# Patient Record
Sex: Male | Born: 1960 | Race: Black or African American | Hispanic: No | Marital: Married | Smoking: Never smoker
Health system: Southern US, Community
[De-identification: ages and names within clinical notes are randomized; demographics above are authoritative.]

## PROBLEM LIST (undated history)

## (undated) DIAGNOSIS — G20A1 Parkinson's disease without dyskinesia, without mention of fluctuations: Secondary | ICD-10-CM

## (undated) DIAGNOSIS — I10 Essential (primary) hypertension: Secondary | ICD-10-CM

## (undated) DIAGNOSIS — R011 Cardiac murmur, unspecified: Secondary | ICD-10-CM

## (undated) DIAGNOSIS — G20 Parkinson's disease: Secondary | ICD-10-CM

## (undated) DIAGNOSIS — J45909 Unspecified asthma, uncomplicated: Secondary | ICD-10-CM

## (undated) DIAGNOSIS — E785 Hyperlipidemia, unspecified: Secondary | ICD-10-CM

## (undated) HISTORY — DX: Cardiac murmur, unspecified: R01.1

## (undated) HISTORY — DX: Parkinson's disease: G20

## (undated) HISTORY — DX: Unspecified asthma, uncomplicated: J45.909

## (undated) HISTORY — PX: CARDIAC ELECTROPHYSIOLOGY STUDY AND ABLATION: SHX1294

## (undated) HISTORY — PX: KNEE ARTHROPLASTY: SHX992

## (undated) HISTORY — DX: Essential (primary) hypertension: I10

## (undated) HISTORY — DX: Parkinson's disease without dyskinesia, without mention of fluctuations: G20.A1

## (undated) HISTORY — DX: Hyperlipidemia, unspecified: E78.5

## (undated) HISTORY — PX: EYE SURGERY: SHX253

## (undated) HISTORY — PX: OTHER SURGICAL HISTORY: SHX169

---

## 2011-01-30 ENCOUNTER — Ambulatory Visit: Payer: Self-pay | Admitting: Unknown Physician Specialty

## 2015-10-16 ENCOUNTER — Encounter: Payer: Self-pay | Admitting: Nurse Practitioner

## 2015-10-25 ENCOUNTER — Ambulatory Visit: Payer: Self-pay

## 2015-10-31 ENCOUNTER — Encounter: Payer: Self-pay | Admitting: Urology

## 2015-10-31 ENCOUNTER — Ambulatory Visit (INDEPENDENT_AMBULATORY_CARE_PROVIDER_SITE_OTHER): Payer: Managed Care, Other (non HMO) | Admitting: Urology

## 2015-10-31 VITALS — BP 141/97 | HR 80 | Ht 70.0 in | Wt 222.7 lb

## 2015-10-31 DIAGNOSIS — R972 Elevated prostate specific antigen [PSA]: Secondary | ICD-10-CM | POA: Diagnosis not present

## 2015-10-31 DIAGNOSIS — N400 Enlarged prostate without lower urinary tract symptoms: Secondary | ICD-10-CM

## 2015-10-31 NOTE — Progress Notes (Signed)
10/31/2015 3:26 PM   James Bush 1960-09-13 [id number]  Referring provider: Ronnell Freshwater, NP [place], [place] [address]  Chief Complaint  Patient presents with  . New Patient (Initial Visit)    elevated PSA    HPI: The patient is a 55 year old gentleman presents for evaluation of a PSA elevated to 6.25. Resolves, the patient has no previous history of an elevated PSA. He did have an episode of hematuria in his 60s and underwent cystoscopy for that with the urologist. He has not had hematuria since that time. He reports a normal voiding at this time. He has no history of nephrolithiasis the urologist he saw his 85s presume this was the cause of his hematuria.   PMH: Past Medical History  Diagnosis Date  . Asthma   . Heart murmur   . Hypertension   . Hyperlipidemia     Surgical History: Past Surgical History  Procedure Laterality Date  . Ablasion      Home Medications:    Medication List       This list is accurate as of: 10/31/15  3:26 PM.  Always use your most recent med list.               hydrochlorothiazide 12.5 MG tablet  Commonly known as:  HYDRODIURIL  TAKE 1 TABLET EVERY DAY FOR BLOOD PRESSURE     metoprolol tartrate 25 MG tablet  Commonly known as:  LOPRESSOR  TAKE 1 TABLET(S) BY MOUTH TWICE A DAY FOR BLOOD PRESSURE     simvastatin 40 MG tablet  Commonly known as:  ZOCOR  Take 40 mg by mouth every evening.        Allergies: No Known Allergies  Family History: Family History  Problem Relation Age of Onset  . Hematuria Neg Hx   . Kidney cancer Neg Hx   . Prostate cancer Neg Hx     Social History:  reports that he has never smoked. He does not have any smokeless tobacco history on file. He reports that he does not drink alcohol or use illicit drugs.  ROS: UROLOGY Frequent Urination?: No Hard to postpone urination?: No Burning/pain with urination?: No Get up at night to urinate?: No Leakage of urine?: No Urine stream  starts and stops?: No Trouble starting stream?: No Do you have to strain to urinate?: No Blood in urine?: No Urinary tract infection?: No Sexually transmitted disease?: No Injury to kidneys or bladder?: No Painful intercourse?: No Weak stream?: No Erection problems?: No Penile pain?: No  Gastrointestinal Nausea?: No Vomiting?: No Indigestion/heartburn?: No Diarrhea?: No Constipation?: No  Constitutional Fever: No Night sweats?: No Weight loss?: No Fatigue?: No  Skin Skin rash/lesions?: No Itching?: No  Eyes Blurred vision?: No Double vision?: No  Ears/Nose/Throat Sore throat?: No Sinus problems?: No  Hematologic/Lymphatic Swollen glands?: No Easy bruising?: No  Cardiovascular Leg swelling?: No Chest pain?: No  Respiratory Cough?: Yes Shortness of breath?: No  Endocrine Excessive thirst?: No  Musculoskeletal Back pain?: No Joint pain?: Yes  Neurological Headaches?: No Dizziness?: No  Psychologic Depression?: No Anxiety?: No  Physical Exam: BP 141/97 mmHg  Pulse 80  Ht 5\' 10"  (1.778 m)  Wt 222 lb 11.2 oz (101.016 kg)  BMI 31.95 kg/m2  Constitutional:  Alert and oriented, No acute distress. HEENT: [place] AT, moist mucus membranes.  Trachea midline, no masses. Cardiovascular: No clubbing, cyanosis, or edema. Respiratory: Normal respiratory effort, no increased work of breathing. GI: Abdomen is soft, nontender, nondistended,  no abdominal masses GU: No CVA tenderness. Normal phallus. Testicles descended equally bilaterally. Nontender palpation. No masses. DRE: 2+, smooth no nodules. Skin: No rashes, bruises or suspicious lesions. Lymph: No cervical or inguinal adenopathy. Neurologic: Grossly intact, no focal deficits, moving all 4 extremities. Psychiatric: Normal mood and affect.  Laboratory Data: No results found for: WBC, HGB, HCT, MCV, PLT  No results found for: CREATININE  No results found for: PSA  No results found for:  TESTOSTERONE  No results found for: HGBA1C  Urinalysis No results found for: COLORURINE, APPEARANCEUR, LABSPEC, PHURINE, GLUCOSEU, HGBUR, BILIRUBINUR, KETONESUR, PROTEINUR, UROBILINOGEN, NITRITE, LEUKOCYTESUR   Assessment & Plan:    I had a long discussion with the patient discussing the role of PSA screening for prostate cancer. He understands it is somewhat controversial tests, however he knows that his elevated place approximately 25% chance of having prostate cancer. We discussed the next step for elevated PSAs to confirm sure elevation by prostate biopsy. We discussed prostate biopsy in great detail. He understands risks, benefits, indications. He understands the risks include but are not limited to bleeding and infection. He understands her of the blood in his stool, seen, and urine after the procedure. He also understands is less than 1% chance that he will need IV antibiotics in the hospital following the procedure if he develops sepsis. All questions were answered and the patient chooses to proceed.  1. Elevated PSA -Confirm elevation with repeat PSA -Tentatively schedule prostate biopsy in 1-2 weeks pending results of above  2. BPH Asymptomatic. No intervention indicated.   Return in about 1 week (around 11/07/2015) for prostate biopsy.  [name], MD  [facility] [name], [place], [place] [address] [phone]

## 2015-11-01 LAB — PSA: PROSTATE SPECIFIC AG, SERUM: 4.5 ng/mL — AB (ref 0.0–4.0)

## 2015-11-04 ENCOUNTER — Telehealth: Payer: Self-pay

## 2015-11-04 NOTE — Telephone Encounter (Signed)
No answer

## 2015-11-04 NOTE — Telephone Encounter (Signed)
-----   Message from [name], MD sent at 11/01/2015 12:58 PM EST ----- Please let patient know this his repeat PSA is still elevated, so we will proceed with prostate biopsy as we discussed yesterday.

## 2015-11-05 NOTE — Telephone Encounter (Signed)
Spoke with pt wife in reference to PSA and prostate bx. Wife voiced understanding.

## 2015-11-14 ENCOUNTER — Encounter: Payer: Self-pay | Admitting: Urology

## 2015-11-14 ENCOUNTER — Other Ambulatory Visit: Payer: Self-pay | Admitting: Urology

## 2015-11-14 ENCOUNTER — Ambulatory Visit (INDEPENDENT_AMBULATORY_CARE_PROVIDER_SITE_OTHER): Payer: Managed Care, Other (non HMO) | Admitting: Urology

## 2015-11-14 VITALS — BP 151/101 | HR 76 | Ht 70.0 in | Wt 223.3 lb

## 2015-11-14 DIAGNOSIS — R972 Elevated prostate specific antigen [PSA]: Secondary | ICD-10-CM

## 2015-11-14 NOTE — Progress Notes (Signed)
Prostate Biopsy Procedure   Informed consent was obtained after discussing risks/benefits of the procedure.  A time out was performed to ensure correct patient identity.  Pre-Procedure: - Last PSA Level: 6.25, 4.5  - Gentamicin 80 mg IM given prophylactically - Levaquin 500 mg administered PO -Transrectal Ultrasound performed revealing a 55 gm prostate -No significant hypoechoic or median lobe noted  Procedure: - Prostate block performed using 10 cc 1% lidocaine and biopsies taken from sextant areas, a total of 12 under ultrasound guidance.  Post-Procedure: - Patient tolerated the procedure well - He was counseled to seek immediate medical attention if experiences any severe pain, significant bleeding, or fevers - Return in one week to discuss biopsy results

## 2015-11-22 LAB — PATHOLOGY REPORT

## 2015-11-26 ENCOUNTER — Other Ambulatory Visit: Payer: Self-pay | Admitting: Urology

## 2015-11-28 ENCOUNTER — Ambulatory Visit (INDEPENDENT_AMBULATORY_CARE_PROVIDER_SITE_OTHER): Payer: Managed Care, Other (non HMO) | Admitting: Urology

## 2015-11-28 VITALS — BP 153/107 | HR 70 | Ht 70.0 in | Wt 224.0 lb

## 2015-11-28 DIAGNOSIS — C61 Malignant neoplasm of prostate: Secondary | ICD-10-CM

## 2015-11-28 NOTE — Progress Notes (Signed)
11/28/2015 12:04 PM   [name] May 05, 1961 [id number]  Referring provider: Lavera Guise, MD [address] [place], [place] [address]  Chief Complaint  Patient presents with  . Prostate Cancer    Biopsy results  . Results    HPI: The patient is a 55 year old gentleman presents for evaluation of a PSA elevated to 6.25. Resolves, the patient has no previous history of an elevated PSA. He did have an episode of hematuria in his 34s and underwent cystoscopy for that with the urologist. He has not had hematuria since that time. He reports a normal voiding at this time. He has no history of nephrolithiasis the urologist he saw his 71s presume this was the cause of his hematuria.   Repeat PSA was 4.5 the patient underwent a prostate biopsy. The biopsy showed one core that was 4% 3+3 = 6 prostate cancer in right medial apex. This was 3 cores of high-grade pin  PMH: Past Medical History  Diagnosis Date  . Asthma   . Heart murmur   . Hypertension   . Hyperlipidemia     Surgical History: Past Surgical History  Procedure Laterality Date  . Ablasion    . Knee arthroplasty Right     Home Medications:    Medication List       This list is accurate as of: 11/28/15 12:04 PM.  Always use your most recent med list.               aspirin 81 MG tablet  Take 81 mg by mouth daily.     hydrochlorothiazide 12.5 MG tablet  Commonly known as:  HYDRODIURIL  TAKE 1 TABLET EVERY DAY FOR BLOOD PRESSURE     metoprolol tartrate 25 MG tablet  Commonly known as:  LOPRESSOR  TAKE 1 TABLET(S) BY MOUTH TWICE A DAY FOR BLOOD PRESSURE     simvastatin 40 MG tablet  Commonly known as:  ZOCOR  Take 40 mg by mouth every evening.        Allergies: No Known Allergies  Family History: Family History  Problem Relation Age of Onset  . Hematuria Neg Hx   . Kidney cancer Neg Hx   . Prostate cancer Neg Hx     Social History:  reports that he has never smoked. He does not have any smokeless  tobacco history on file. He reports that he does not drink alcohol or use illicit drugs.  ROS: UROLOGY Frequent Urination?: No Hard to postpone urination?: No Burning/pain with urination?: No Get up at night to urinate?: No Leakage of urine?: No Urine stream starts and stops?: No Trouble starting stream?: No Do you have to strain to urinate?: No Blood in urine?: Yes Urinary tract infection?: No Sexually transmitted disease?: No Injury to kidneys or bladder?: No Painful intercourse?: No Weak stream?: No Erection problems?: No Penile pain?: No  Gastrointestinal Nausea?: No Vomiting?: No Indigestion/heartburn?: No Diarrhea?: No Constipation?: No  Constitutional Fever: No Night sweats?: No Weight loss?: No Fatigue?: No  Skin Skin rash/lesions?: No Itching?: No  Eyes Blurred vision?: No Double vision?: No  Ears/Nose/Throat Sore throat?: No Sinus problems?: No  Hematologic/Lymphatic Swollen glands?: No Easy bruising?: No  Cardiovascular Leg swelling?: No Chest pain?: No  Respiratory Cough?: No Shortness of breath?: No  Endocrine Excessive thirst?: No  Musculoskeletal Back pain?: No Joint pain?: Yes  Neurological Headaches?: No Dizziness?: No  Psychologic Depression?: No Anxiety?: No  Physical Exam: BP 153/107 mmHg  Pulse 70  Ht 5'  10" (1.778 m)  Wt 224 lb (101.606 kg)  BMI 32.14 kg/m2  Constitutional:  Alert and oriented, No acute distress. HEENT: [place] AT, moist mucus membranes.  Trachea midline, no masses. Cardiovascular: No clubbing, cyanosis, or edema. Respiratory: Normal respiratory effort, no increased work of breathing. GI: Abdomen is soft, nontender, nondistended, no abdominal masses GU: No CVA tenderness.  Skin: No rashes, bruises or suspicious lesions. Lymph: No cervical or inguinal adenopathy. Neurologic: Grossly intact, no focal deficits, moving all 4 extremities. Psychiatric: Normal mood and affect.  Laboratory Data: No  results found for: WBC, HGB, HCT, MCV, PLT  No results found for: CREATININE  No results found for: PSA  No results found for: TESTOSTERONE  No results found for: HGBA1C  Urinalysis No results found for: COLORURINE, APPEARANCEUR, LABSPEC, PHURINE, GLUCOSEU, HGBUR, BILIRUBINUR, KETONESUR, PROTEINUR, UROBILINOGEN, NITRITE, LEUKOCYTESUR  Assessment & Plan:    1. 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 We had an extended conversation regarding the natural history of prostate cancer and the various treatment and monitoring approaches. We discussed in great detail watchful waiting, active surveillance, prostatectomy, radiation therapy. He understands that he is very low risk prostate cancer, and that the trend today is active surveillance. He understands the goal of active surveillance is not denied treatment for prostate cancer but to delay until if and when necessary for more advanced disease. He understands he will need close monitoring if he chooses this pathway. He will need confirmatory prostate biopsy in 3 months to begin with. He will also need frequent DRE/PSA checks. He will also need a repeat prostate biopsy in 3 years if the above are stable. He understands the risk of this bifid includes sampling air that Mrs. more aggressive disease, possible missing of the window of cure, possible inability to perform a nerve sparing surgery, and frequent doctor's visits. The patient was given 100 questions of prostate cancer book. He will follow-up in 3 months for repeat prostate biopsy.  Return in about 3 months (around 02/28/2016) for prostate biopsy.  [name], MD  [facility] [address], [place], [place] [address] [phone]

## 2016-02-27 ENCOUNTER — Other Ambulatory Visit: Payer: Managed Care, Other (non HMO)

## 2016-03-05 ENCOUNTER — Ambulatory Visit: Payer: Managed Care, Other (non HMO)

## 2016-03-12 ENCOUNTER — Other Ambulatory Visit: Payer: Managed Care, Other (non HMO)

## 2016-03-19 ENCOUNTER — Ambulatory Visit: Payer: Managed Care, Other (non HMO)

## 2016-06-17 ENCOUNTER — Encounter: Payer: Self-pay | Admitting: Urology

## 2016-06-17 ENCOUNTER — Other Ambulatory Visit: Payer: Self-pay | Admitting: Urology

## 2016-06-17 ENCOUNTER — Ambulatory Visit (INDEPENDENT_AMBULATORY_CARE_PROVIDER_SITE_OTHER): Payer: Managed Care, Other (non HMO) | Admitting: Urology

## 2016-06-17 VITALS — BP 132/90 | HR 86 | Ht 70.0 in | Wt 218.8 lb

## 2016-06-17 DIAGNOSIS — R972 Elevated prostate specific antigen [PSA]: Secondary | ICD-10-CM

## 2016-06-17 MED ORDER — GENTAMICIN SULFATE 40 MG/ML IJ SOLN
80.0000 mg | Freq: Once | INTRAMUSCULAR | Status: AC
  Administered 2016-06-17: 80 mg via INTRAMUSCULAR

## 2016-06-17 MED ORDER — LEVOFLOXACIN 500 MG PO TABS
500.0000 mg | ORAL_TABLET | Freq: Once | ORAL | Status: AC
  Administered 2016-06-17: 500 mg via ORAL

## 2016-06-17 NOTE — Progress Notes (Signed)
Prostate Biopsy Procedure   HPI: History of elevated PSA (6.25 followed by 4.5) with previous biopsy results below presenting for active surveillance repeat prostate biopsy.   Previous biopsy: One core that was 4% 3+3 = 6 prostate cancer in right medial apex. Three cores of high-grade pin    Informed consent was obtained after discussing risks/benefits of the procedure.  A time out was performed to ensure correct patient identity.  Pre-Procedure: - Last PSA Level: 4.5 - Gentamicin given prophylactically - Levaquin 500 mg administered PO -Transrectal Ultrasound performed revealing a 52.82 gm prostate -No significant hypoechoic or median lobe noted  Procedure: - Prostate block performed using 10 cc 1% lidocaine and biopsies taken from sextant areas, a total of 12 under ultrasound guidance.  Post-Procedure: - Patient tolerated the procedure well - He was counseled to seek immediate medical attention if experiences any severe pain, significant bleeding, or fevers - Return in one week to discuss biopsy results - PSA drawn today pre-procedure.

## 2016-06-18 ENCOUNTER — Other Ambulatory Visit: Payer: Managed Care, Other (non HMO)

## 2016-06-18 LAB — PSA: PROSTATE SPECIFIC AG, SERUM: 8.5 ng/mL — AB (ref 0.0–4.0)

## 2016-06-23 LAB — PATHOLOGY REPORT

## 2016-06-24 ENCOUNTER — Other Ambulatory Visit: Payer: Self-pay | Admitting: Urology

## 2016-06-25 ENCOUNTER — Ambulatory Visit: Payer: Managed Care, Other (non HMO)

## 2016-06-29 ENCOUNTER — Encounter: Payer: Self-pay | Admitting: Urology

## 2016-06-29 ENCOUNTER — Ambulatory Visit (INDEPENDENT_AMBULATORY_CARE_PROVIDER_SITE_OTHER): Payer: Managed Care, Other (non HMO) | Admitting: Urology

## 2016-06-29 VITALS — BP 159/96 | HR 87 | Ht 70.0 in | Wt 220.1 lb

## 2016-06-29 DIAGNOSIS — C61 Malignant neoplasm of prostate: Secondary | ICD-10-CM

## 2016-06-29 NOTE — Progress Notes (Signed)
History of elevated PSA (6.25 followed by 4.5) with previous biopsy results below presenting for active surveillance repeat prostate biopsy.  PSA History:  6.25 in February 2017 4.5 in March 2017 8.5 in October 2017  Previous biopsy: March 2017: 1/12 core (4%) 3+3 = 6 prostate cancer in right medial apex. Three cores of high-grade pin October 2017: 2/12 cores 3+3=6,  Right medial apex (7%), Right lateral apex (33%)  Shim: Unknown Voiding symptoms: Minimal  Interval: The patient presents today for further discussion of his recent prostate biopsy and the results. He has been on active surveillance since March 2017. He presents today for biopsy results. The patient tolerated his biopsy well. He denies any hematuria or dysuria. He no longer is having any bleeding per rectum or urethra. He did not have any fever/chills.  Current Outpatient Prescriptions on File Prior to Visit  Medication Sig Dispense Refill  . aspirin 81 MG tablet Take 81 mg by mouth daily.    . hydrochlorothiazide (HYDRODIURIL) 12.5 MG tablet TAKE 1 TABLET EVERY DAY FOR BLOOD PRESSURE  1  . metoprolol tartrate (LOPRESSOR) 25 MG tablet TAKE 1 TABLET(S) BY MOUTH TWICE A DAY FOR BLOOD PRESSURE  0  . simvastatin (ZOCOR) 40 MG tablet Take 40 mg by mouth every evening.  5   No current facility-administered medications on file prior to visit.    Past Surgical History:  Procedure Laterality Date  . ablasion    . KNEE ARTHROPLASTY Right    Past Medical History:  Diagnosis Date  . Asthma   . Heart murmur   . Hyperlipidemia   . Hypertension    PE NAD Vitals:   06/29/16 1607  BP: (!) 159/96  Pulse: 87   UA: Not available  Impression: The patient has very low risk prostate cancer. Recommendations: Again, we discussed the treatment options for patient with very low risk prostate cancer. The patient had settled previously on active surveillance. I don't see any reason why he should not continue this. However, I did  discuss with the patient alternatives including surgery and brachytherapy. Given the patient's young age, these are not unreasonable alternatives. I then went over the general practice of active surveillance which includes PSA blood checks every 6 months and rectal exams once a year. We also discussed the timing of repeat prostate biopsies. I told him that this hinged on his PSA and digital rectal exam or at a two-year time interval, which ever is necessary.  The patient will remain on active surveillance. He will return to clinic in 6 months with a PSA prior.

## 2016-07-16 ENCOUNTER — Other Ambulatory Visit: Payer: Managed Care, Other (non HMO)

## 2016-12-28 ENCOUNTER — Other Ambulatory Visit: Payer: Self-pay

## 2016-12-28 ENCOUNTER — Other Ambulatory Visit: Payer: 59

## 2016-12-28 DIAGNOSIS — C61 Malignant neoplasm of prostate: Secondary | ICD-10-CM

## 2016-12-29 LAB — PSA: Prostate Specific Ag, Serum: 7 ng/mL — ABNORMAL HIGH (ref 0.0–4.0)

## 2016-12-30 ENCOUNTER — Telehealth: Payer: Self-pay

## 2016-12-30 NOTE — Telephone Encounter (Signed)
Ardis Hughs, MD  Lestine Box, LPN        PSA stable    Spoke with pt in reference to PSA results. Pt voiced understanding.

## 2016-12-31 ENCOUNTER — Ambulatory Visit: Payer: Managed Care, Other (non HMO)

## 2017-01-07 ENCOUNTER — Ambulatory Visit: Payer: Managed Care, Other (non HMO)

## 2017-01-07 ENCOUNTER — Encounter: Payer: Self-pay | Admitting: Urology

## 2017-01-07 ENCOUNTER — Ambulatory Visit (INDEPENDENT_AMBULATORY_CARE_PROVIDER_SITE_OTHER): Payer: 59 | Admitting: Urology

## 2017-01-07 VITALS — BP 156/94 | HR 69 | Ht 70.0 in | Wt 222.7 lb

## 2017-01-07 DIAGNOSIS — C61 Malignant neoplasm of prostate: Secondary | ICD-10-CM

## 2017-01-07 NOTE — Progress Notes (Signed)
01/07/2017 9:47 AM   James Bush 21-Mar-1961 [id number]  Referring provider: Lavera Guise, [place], [place] [address]  Chief Complaint  Patient presents with  . Follow-up    HPI: The patient presents today for follow-up of active surveillance for his very low risk prostate cancer.  PSA History:  6.25 in February 2017 4.5 in March 2017 8.5 in October 2017 7.0 in April 2018  Previous biopsy: March 2017: 1/12 core (4%) 3+3 = 6 prostate cancer in right medial apex. Three cores of high-grade pin October 2017: 2/12 cores 3+3=6,  Right medial apex (7%), Right lateral apex (33%)    Reports no complaints and his last visit. He has no nocturia. He has a good stream. He emptied his bladder well. He has no hematuria, UTIs, or stones.  PMH: Past Medical History:  Diagnosis Date  . Asthma   . Heart murmur   . Hyperlipidemia   . Hypertension     Surgical History: Past Surgical History:  Procedure Laterality Date  . ablasion    . KNEE ARTHROPLASTY Right     Home Medications:  Allergies as of 01/07/2017   No Known Allergies     Medication List       Accurate as of 01/07/17  9:47 AM. Always use your most recent med list.          aspirin 81 MG tablet Take 81 mg by mouth daily.   hydrochlorothiazide 12.5 MG tablet Commonly known as:  HYDRODIURIL TAKE 1 TABLET EVERY DAY FOR BLOOD PRESSURE   metoprolol tartrate 25 MG tablet Commonly known as:  LOPRESSOR TAKE 1 TABLET(S) BY MOUTH TWICE A DAY FOR BLOOD PRESSURE   simvastatin 40 MG tablet Commonly known as:  ZOCOR Take 40 mg by mouth every evening.       Allergies: No Known Allergies  Family History: Family History  Problem Relation Age of Onset  . Hematuria Neg Hx   . Kidney cancer Neg Hx   . Prostate cancer Neg Hx     Social History:  reports that he has never smoked. He does not have any smokeless tobacco history on file. He reports that he does not drink alcohol or use  drugs.  ROS: UROLOGY Frequent Urination?: No Hard to postpone urination?: No Burning/pain with urination?: No Get up at night to urinate?: No Leakage of urine?: No Urine stream starts and stops?: No Trouble starting stream?: No Do you have to strain to urinate?: No Blood in urine?: No Urinary tract infection?: No Sexually transmitted disease?: No Injury to kidneys or bladder?: No Painful intercourse?: No Weak stream?: No Erection problems?: No Penile pain?: No  Gastrointestinal Nausea?: No Vomiting?: No Indigestion/heartburn?: No Diarrhea?: No Constipation?: No  Constitutional Fever: No Night sweats?: No Weight loss?: No Fatigue?: No  Skin Skin rash/lesions?: No Itching?: No  Eyes Blurred vision?: No Double vision?: No  Ears/Nose/Throat Sore throat?: No Sinus problems?: No  Hematologic/Lymphatic Swollen glands?: No Easy bruising?: No  Cardiovascular Leg swelling?: No Chest pain?: No  Respiratory Cough?: No Shortness of breath?: No  Endocrine Excessive thirst?: No  Musculoskeletal Back pain?: No Joint pain?: No  Neurological Headaches?: No Dizziness?: No  Psychologic Depression?: No Anxiety?: No  Physical Exam: BP (!) 156/94 (BP Location: Left Arm, Patient Position: Sitting, Cuff Size: Large)   Pulse 69   Ht 5\' 10"  (1.778 m)   Wt 222 lb 11.2 oz (101 kg)   BMI 31.95 kg/m   Constitutional:  Alert  and oriented, No acute distress. HEENT: [place] AT, moist mucus membranes.  Trachea midline, no masses. Cardiovascular: No clubbing, cyanosis, or edema. Respiratory: Normal respiratory effort, no increased work of breathing. GI: Abdomen is soft, nontender, nondistended, no abdominal masses GU: No CVA tenderness.  Skin: No rashes, bruises or suspicious lesions. Lymph: No cervical or inguinal adenopathy. Neurologic: Grossly intact, no focal deficits, moving all 4 extremities. Psychiatric: Normal mood and affect.  Laboratory Data: No results  found for: WBC, HGB, HCT, MCV, PLT  No results found for: CREATININE  No results found for: PSA  No results found for: TESTOSTERONE  No results found for: HGBA1C  Urinalysis No results found for: COLORURINE, APPEARANCEUR, LABSPEC, PHURINE, GLUCOSEU, HGBUR, BILIRUBINUR, KETONESUR, PROTEINUR, UROBILINOGEN, NITRITE, LEUKOCYTESUR   Assessment & Plan:    1. 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  [name], MD  [facility] [address] [place], [place] [address] [phone]

## 2017-06-09 ENCOUNTER — Other Ambulatory Visit: Payer: Self-pay | Admitting: Nurse Practitioner

## 2017-06-09 ENCOUNTER — Ambulatory Visit
Admission: RE | Admit: 2017-06-09 | Discharge: 2017-06-09 | Disposition: A | Discharge status: Home / Self Care | Payer: 59 | Source: Ambulatory Visit | Attending: Nurse Practitioner | Admitting: Nurse Practitioner

## 2017-06-09 DIAGNOSIS — M25511 Pain in right shoulder: Secondary | ICD-10-CM | POA: Insufficient documentation

## 2017-06-09 DIAGNOSIS — R52 Pain, unspecified: Secondary | ICD-10-CM

## 2017-07-06 ENCOUNTER — Other Ambulatory Visit: Payer: 59

## 2017-07-06 ENCOUNTER — Other Ambulatory Visit: Payer: Self-pay

## 2017-07-06 DIAGNOSIS — C61 Malignant neoplasm of prostate: Secondary | ICD-10-CM

## 2017-07-07 LAB — PSA: PROSTATE SPECIFIC AG, SERUM: 7.1 ng/mL — AB (ref 0.0–4.0)

## 2017-07-08 ENCOUNTER — Ambulatory Visit: Payer: 59 | Admitting: Urology

## 2017-07-08 ENCOUNTER — Encounter: Payer: Self-pay | Admitting: Urology

## 2017-07-08 VITALS — BP 154/102 | HR 73 | Ht 70.0 in | Wt 217.4 lb

## 2017-07-08 DIAGNOSIS — C61 Malignant neoplasm of prostate: Secondary | ICD-10-CM

## 2017-07-08 NOTE — Progress Notes (Signed)
07/08/2017 10:00 AM   James Bush 1960/10/31 [id number]  Referring provider: Lavera Guise, [place], [place] [address]  Chief Complaint  Patient presents with  . Prostate Cancer    HPI: The patient presents today for follow-up of active surveillance for his very low risk prostate cancer.  PSA History:  6.25 in February 2017 4.5 in March 2017 8.5 in October 2017 7.0 in April 2018 7.1 in Nov 2018  Previous biopsy: March 2017: 1/12 core (4%)3+3 = 6 prostate cancer in right medial apex. Three cores of high-grade pin October 2017: 2/12 cores 3+3=6, Right medial apex (7%), Right lateral apex (33%)    Reports no complaints and his last visit. He has no nocturia. He has a good stream. He emptied his bladder well. He has no hematuria, UTIs, or stones.  PMH: Past Medical History:  Diagnosis Date  . Asthma   . Heart murmur   . Hyperlipidemia   . Hypertension     Surgical History: Past Surgical History:  Procedure Laterality Date  . ablasion    . KNEE ARTHROPLASTY Right     Home Medications:  Allergies as of 07/08/2017   No Known Allergies     Medication List        Accurate as of 07/08/17 10:00 AM. Always use your most recent med list.          aspirin 81 MG tablet Take 81 mg by mouth daily.   hydrochlorothiazide 12.5 MG tablet Commonly known as:  HYDRODIURIL TAKE 1 TABLET EVERY DAY FOR BLOOD PRESSURE   metoprolol tartrate 25 MG tablet Commonly known as:  LOPRESSOR TAKE 1 TABLET(S) BY MOUTH TWICE A DAY FOR BLOOD PRESSURE   simvastatin 40 MG tablet Commonly known as:  ZOCOR Take 40 mg by mouth every evening.       Allergies: No Known Allergies  Family History: Family History  Problem Relation Age of Onset  . Hematuria Neg Hx   . Kidney cancer Neg Hx   . Prostate cancer Neg Hx     Social History:  reports that  has never smoked. he has never used smokeless tobacco. He reports that he does not drink alcohol or use  drugs.  ROS: UROLOGY Frequent Urination?: No Hard to postpone urination?: No Burning/pain with urination?: No Get up at night to urinate?: No Leakage of urine?: No Urine stream starts and stops?: No Trouble starting stream?: No Do you have to strain to urinate?: No Blood in urine?: No Urinary tract infection?: No Sexually transmitted disease?: No Injury to kidneys or bladder?: No Painful intercourse?: No Weak stream?: No Erection problems?: No Penile pain?: No  Gastrointestinal Nausea?: No Vomiting?: No Indigestion/heartburn?: No Diarrhea?: No Constipation?: No  Constitutional Fever: No Night sweats?: No Weight loss?: No Fatigue?: No  Skin Skin rash/lesions?: No Itching?: No  Eyes Blurred vision?: No Double vision?: No  Ears/Nose/Throat Sore throat?: No Sinus problems?: No  Hematologic/Lymphatic Swollen glands?: No Easy bruising?: No  Cardiovascular Leg swelling?: No Chest pain?: No  Respiratory Cough?: No Shortness of breath?: No  Endocrine Excessive thirst?: No  Musculoskeletal Back pain?: No Joint pain?: No  Neurological Headaches?: No Dizziness?: No  Psychologic Depression?: No Anxiety?: No  Physical Exam: BP (!) 154/102 (BP Location: Right Arm, Patient Position: Sitting, Cuff Size: Normal)   Pulse 73   Ht 5\' 10"  (1.778 m)   Wt 217 lb 6.4 oz (98.6 kg)   BMI 31.19 kg/m   Constitutional:  Alert and  oriented, No acute distress. HEENT: [place] AT, moist mucus membranes.  Trachea midline, no masses. Cardiovascular: No clubbing, cyanosis, or edema. Respiratory: Normal respiratory effort, no increased work of breathing. GI: Abdomen is soft, nontender, nondistended, no abdominal masses GU: No CVA tenderness.  DRE: 2+ benign. Skin: No rashes, bruises or suspicious lesions. Lymph: No cervical or inguinal adenopathy. Neurologic: Grossly intact, no focal deficits, moving all 4 extremities. Psychiatric: Normal mood and affect.  Laboratory  Data: No results found for: WBC, HGB, HCT, MCV, PLT  No results found for: CREATININE  No results found for: PSA  No results found for: TESTOSTERONE  No results found for: HGBA1C  Urinalysis No results found for: COLORURINE, APPEARANCEUR, LABSPEC, PHURINE, GLUCOSEU, HGBUR, BILIRUBINUR, KETONESUR, PROTEINUR, UROBILINOGEN, NITRITE, LEUKOCYTESUR   Assessment & Plan:    1. Very low risk prostate cancer - PSA stable today -continue active surveillance -repeat PSA in 6 months  Return in about 6 months (around 01/05/2018) for PSA prior.  [name], MD  [facility] [address] [place],  [address] [phone]

## 2017-09-16 ENCOUNTER — Other Ambulatory Visit: Payer: Self-pay | Admitting: Internal Medicine

## 2017-12-12 ENCOUNTER — Other Ambulatory Visit: Payer: Self-pay | Admitting: Internal Medicine

## 2017-12-30 ENCOUNTER — Other Ambulatory Visit: Payer: 59

## 2017-12-30 ENCOUNTER — Other Ambulatory Visit: Payer: Managed Care, Other (non HMO)

## 2017-12-30 DIAGNOSIS — C61 Malignant neoplasm of prostate: Secondary | ICD-10-CM

## 2017-12-31 LAB — PSA: Prostate Specific Ag, Serum: 7.6 ng/mL — ABNORMAL HIGH (ref 0.0–4.0)

## 2018-01-06 ENCOUNTER — Ambulatory Visit: Payer: 59 | Admitting: Urology

## 2018-01-06 ENCOUNTER — Encounter: Payer: Self-pay | Admitting: Urology

## 2018-01-06 VITALS — BP 92/66 | HR 67 | Ht 70.0 in | Wt 218.4 lb

## 2018-01-06 DIAGNOSIS — C61 Malignant neoplasm of prostate: Secondary | ICD-10-CM | POA: Diagnosis not present

## 2018-01-06 NOTE — Progress Notes (Signed)
01/06/2018 8:38 AM   James Bush 25-Mar-1961 [id number]  Referring provider: Lavera Guise, [place], [place] [address]  Chief Complaint  Patient presents with  . Prostate Cancer    HPI: The patient is a 57 year old male who presents today for follow-up of active surveillance for his very low risk prostate cancer.  PSA History:  6.25 in February 2017 4.5 in March 2017 8.5 in October 2017 7.0 in April 2018 7.1 in Nov 2018 7.6 in May 2019  DRE 2+ benign in November 2018.   Previous biopsy: March 2017: 1/12 core (4%)3+3 = 6 prostate cancer in right medial apex. Three cores of high-grade pin October 2017: 2/12 cores 3+3=6, Right medial apex (7%), Right lateral apex (33%)  He does note a weak stream approximately 75% of the time with a feeling of incomplete bladder emptying.  However, he  denies nocturia, straining, hesitancy, urgency, and frequency.  He is not at all bothered by his weak stream.  He denies hematuria, nephrolithiasis, or UTI.   PMH: Past Medical History:  Diagnosis Date  . Asthma   . Heart murmur   . Hyperlipidemia   . Hypertension     Surgical History: Past Surgical History:  Procedure Laterality Date  . ablasion    . KNEE ARTHROPLASTY Right     Home Medications:  Allergies as of 01/06/2018   No Known Allergies     Medication List        Accurate as of 01/06/18  8:38 AM. Always use your most recent med list.          aspirin 81 MG tablet Take 81 mg by mouth daily.   hydrochlorothiazide 12.5 MG tablet Commonly known as:  HYDRODIURIL TAKE 1 TABLET(S) BY MOUTH DAILY FOR BLOOD PRESSURE   metoprolol tartrate 25 MG tablet Commonly known as:  LOPRESSOR TAKE 1 TABLET(S) BY MOUTH TWICE A DAY FOR BLOOD PRESSURE   simvastatin 40 MG tablet Commonly known as:  ZOCOR TAKE 1 TABLET BY MOUTH EVERY DAY AT NIGHT       Allergies: No Known Allergies  Family History: Family History  Problem Relation Age of Onset  .  Hematuria Neg Hx   . Kidney cancer Neg Hx   . Prostate cancer Neg Hx     Social History:  reports that he has never smoked. He has never used smokeless tobacco. He reports that he does not drink alcohol or use drugs.  ROS: UROLOGY Frequent Urination?: No Hard to postpone urination?: No Burning/pain with urination?: No Get up at night to urinate?: No Leakage of urine?: No Urine stream starts and stops?: No Trouble starting stream?: No Do you have to strain to urinate?: No Blood in urine?: No Urinary tract infection?: No Sexually transmitted disease?: No Injury to kidneys or bladder?: No Painful intercourse?: No Weak stream?: No Erection problems?: No Penile pain?: No  Gastrointestinal Nausea?: No Vomiting?: No Indigestion/heartburn?: No Diarrhea?: No Constipation?: No  Constitutional Fever: No Night sweats?: No Weight loss?: No Fatigue?: No  Skin Skin rash/lesions?: No Itching?: No  Eyes Blurred vision?: No Double vision?: No  Ears/Nose/Throat Sore throat?: No Sinus problems?: No  Hematologic/Lymphatic Swollen glands?: No Easy bruising?: No  Cardiovascular Leg swelling?: No Chest pain?: No  Respiratory Cough?: No Shortness of breath?: No  Endocrine Excessive thirst?: No  Musculoskeletal Back pain?: No Joint pain?: No  Neurological Headaches?: No Dizziness?: No  Psychologic Depression?: No Anxiety?: No  Physical Exam: BP 92/66 (BP  Location: Right Arm, Patient Position: Sitting, Cuff Size: Normal)   Pulse 67   Ht 5\' 10"  (1.778 m)   Wt 218 lb 6.4 oz (99.1 kg)   BMI 31.34 kg/m   Constitutional:  Alert and oriented, No acute distress. HEENT: [place] AT, moist mucus membranes.  Trachea midline, no masses. Cardiovascular: No clubbing, cyanosis, or edema. Respiratory: Normal respiratory effort, no increased work of breathing. GI: Abdomen is soft, nontender, nondistended, no abdominal masses GU: No CVA tenderness.  Skin: No rashes, bruises  or suspicious lesions. Lymph: No cervical or inguinal adenopathy. Neurologic: Grossly intact, no focal deficits, moving all 4 extremities. Psychiatric: Normal mood and affect.  Laboratory Data: No results found for: WBC, HGB, HCT, MCV, PLT  No results found for: CREATININE  No results found for: PSA  No results found for: TESTOSTERONE  No results found for: HGBA1C  Urinalysis No results found for: COLORURINE, APPEARANCEUR, LABSPEC, PHURINE, GLUCOSEU, HGBUR, BILIRUBINUR, KETONESUR, PROTEINUR, UROBILINOGEN, NITRITE, LEUKOCYTESUR  Assessment & Plan:    1. Very low risk prostate cancer  -Slight rise in PSA over the last 6 months, however it is still almost a point lower than it was at the time of his last biopsy. -Continue active surveillance -Follow-up in 6 months fore DRE with PSA prior.  Return in about 6 months (around 07/09/2018) for PSA prior.  [name], MD  [facility] [address] [place], [place] [address] [phone]

## 2018-02-10 ENCOUNTER — Encounter: Payer: Self-pay | Admitting: Nurse Practitioner

## 2018-02-10 ENCOUNTER — Ambulatory Visit (INDEPENDENT_AMBULATORY_CARE_PROVIDER_SITE_OTHER): Payer: 59 | Admitting: Nurse Practitioner

## 2018-02-10 VITALS — BP 138/82 | HR 77 | Resp 16 | Ht 70.0 in | Wt 221.2 lb

## 2018-02-10 DIAGNOSIS — E785 Hyperlipidemia, unspecified: Secondary | ICD-10-CM | POA: Insufficient documentation

## 2018-02-10 DIAGNOSIS — Z0001 Encounter for general adult medical examination with abnormal findings: Secondary | ICD-10-CM | POA: Diagnosis not present

## 2018-02-10 DIAGNOSIS — M6281 Muscle weakness (generalized): Secondary | ICD-10-CM | POA: Insufficient documentation

## 2018-02-10 DIAGNOSIS — I10 Essential (primary) hypertension: Secondary | ICD-10-CM | POA: Diagnosis not present

## 2018-02-10 DIAGNOSIS — E782 Mixed hyperlipidemia: Secondary | ICD-10-CM

## 2018-02-10 DIAGNOSIS — R300 Dysuria: Secondary | ICD-10-CM | POA: Insufficient documentation

## 2018-02-10 NOTE — Progress Notes (Signed)
[facility] [place], [place] [address]  Internal MEDICINE  Office Visit Note  Patient Name: James Bush  [id number]  [id number]  Date of Service: 02/10/2018  Chief Complaint  Patient presents with  . Annual Exam  . right side    slower than normal and carries right arm like trying to protect it. believes to have had a stroke had a MRI done a month ago     The patient has been having right sided weaknsss for some time. Started with right arm. Would hold it closer to the body, as though it were injured. X-rayed the shoulder and there were no acute abnormalities. Over time ,he has developed a tremor in the right hand, mostly with exertion. Will resolve when he rests his arm. He is also having weaknes in right leg. Will have to use his arms in order to step up or to lift the right leg. He went to ER 05/2017, where they did CT and MRI of the brain. No acute abnormalities were found. There were some flare abnormalities and possible small vessel ischemic disease. Tremor and right sided weakness continues to get worse.   Pt is here for routine health maintenance examination  Current Medication: Outpatient Encounter Medications as of 02/10/2018  Medication Sig  . hydrochlorothiazide (HYDRODIURIL) 12.5 MG tablet TAKE 1 TABLET(S) BY MOUTH DAILY FOR BLOOD PRESSURE  . metoprolol tartrate (LOPRESSOR) 25 MG tablet TAKE 1 TABLET(S) BY MOUTH TWICE A DAY FOR BLOOD PRESSURE  . simvastatin (ZOCOR) 40 MG tablet TAKE 1 TABLET BY MOUTH EVERY DAY AT NIGHT  . aspirin 81 MG tablet Take 81 mg by mouth daily.   No facility-administered encounter medications on file as of 02/10/2018.     Surgical History: Past Surgical History:  Procedure Laterality Date  . ablasion    . KNEE ARTHROPLASTY Right     Medical History: Past Medical History:  Diagnosis Date  . Asthma   . Heart murmur   . Hyperlipidemia   . Hypertension     Family History: Family History  Problem Relation Age  of Onset  . Hematuria Neg Hx   . Kidney cancer Neg Hx   . Prostate cancer Neg Hx       Review of Systems  Constitutional: Positive for activity change. Negative for chills, fatigue and unexpected weight change.  HENT: Negative for congestion, postnasal drip, rhinorrhea, sneezing and sore throat.   Eyes: Negative.  Negative for redness.  Respiratory: Negative for cough, chest tightness and shortness of breath.   Cardiovascular: Negative for chest pain and palpitations.  Gastrointestinal: Negative for abdominal pain, constipation, diarrhea, nausea and vomiting.  Endocrine: Negative for cold intolerance, heat intolerance, polydipsia, polyphagia and polyuria.  Genitourinary: Negative.  Negative for dysuria and frequency.  Musculoskeletal: Positive for arthralgias. Negative for back pain, joint swelling and neck pain.       Right upper leg aches  Skin: Negative for rash.  Allergic/Immunologic: Negative for environmental allergies.  Neurological: Positive for tremors and weakness. Negative for numbness.       Right sided upper and lower extremity weakness. Tremor present in right hand with exertion.   Hematological: Negative for adenopathy. Does not bruise/bleed easily.  Psychiatric/Behavioral: Negative for behavioral problems (Depression), dysphoric mood, sleep disturbance and suicidal ideas. The patient is not nervous/anxious.      Today's Vitals   02/10/18 0955  BP: 138/82  Pulse: 77  Resp: 16  SpO2: 96%  Weight: 221 lb 3.2 oz (100.3  kg)  Height: 5\' 10"  (1.778 m)   Physical Exam  Constitutional: He is oriented to person, place, and time. He appears well-developed and well-nourished. No distress.  HENT:  Head: Normocephalic and atraumatic.  Nose: Nose normal.  Mouth/Throat: Oropharynx is clear and moist. No oropharyngeal exudate.  Eyes: Pupils are equal, round, and reactive to light. Conjunctivae and EOM are normal.  Neck: Normal range of motion. Neck supple. No JVD present.  Carotid bruit is not present. No tracheal deviation present. No thyromegaly present.  Cardiovascular: Normal rate, regular rhythm, normal heart sounds and intact distal pulses. Exam reveals no gallop and no friction rub.  No murmur heard. Pulmonary/Chest: Effort normal and breath sounds normal. No respiratory distress. He has no wheezes. He has no rales. He exhibits no tenderness.  Abdominal: Soft. Bowel sounds are normal. There is no tenderness.  Musculoskeletal: Normal range of motion.  Lymphadenopathy:    He has no cervical adenopathy.  Neurological: He is alert and oriented to person, place, and time. No cranial nerve deficit.  Pupils are equal, round, and reactive to light and accomodation. Cranial nerves showing mild asymmetry on the right side of the face and right accessory nerve. Strength in right arm is slightly diminished from left .  Skin: Skin is warm and dry. Capillary refill takes less than 2 seconds. He is not diaphoretic.  Psychiatric: He has a normal mood and affect. His behavior is normal. Judgment and thought content normal.  Nursing note and vitals reviewed.    LABS: Recent Results (from the past 2160 hour(s))  PSA     Status: Abnormal   Collection Time: 12/30/17  8:25 AM  Result Value Ref Range   Prostate Specific Ag, Serum 7.6 (H) 0.0 - 4.0 ng/mL    Comment: Roche ECLIA methodology. According to the American Urological Association, Serum PSA should decrease and remain at undetectable levels after radical prostatectomy. The AUA defines biochemical recurrence as an initial PSA value 0.2 ng/mL or greater followed by a subsequent confirmatory PSA value 0.2 ng/mL or greater. Values obtained with different assay methods or kits cannot be used interchangeably. Results cannot be interpreted as absolute evidence of the presence or absence of malignant disease.      Assessment/Plan: 1. Encounter for general adult medical examination with abnormal findings Annual  wellness visit today. Labs done at [facility] and patient to bring copy of results when he receives them.   2. Right-sided muscle weakness Progressing with tremor in right hand developing. MRI in 05/2017 showing no acute abnormality, hemorrhage, or midline shift. Did show some flair abnormality in right side. Will refer to neurology for further evaluation and treatment.  - Ambulatory referral to Neurology  3. Essential hypertension Stable. Continue bp medication as prescribed.   4. Mixed hyperlipidemia Fasting lipid panel has been done. Will adjust statin as indicated.   5. Dysuria - Urinalysis, Routine w reflex microscopic  General Counseling: Kinan verbalizes understanding of the findings of todays visit and agrees with plan of treatment. I have discussed any further diagnostic evaluation that may be needed or ordered today. We also reviewed his medications today. he has been encouraged to call the office with any questions or concerns that should arise related to todays visit.    Counseling:  Hypertension Counseling:   The following hypertensive lifestyle modification were recommended and discussed:  1. Limiting alcohol intake to less than 1 oz/day of ethanol:(24 oz of beer or 8 oz of wine or 2 oz of 100-proof whiskey). 2.  Take baby ASA 81 mg daily. 3. Importance of regular aerobic exercise and losing weight. 4. Reduce dietary saturated fat and cholesterol intake for overall cardiovascular health. 5. Maintaining adequate dietary potassium, calcium, and magnesium intake. 6. Regular monitoring of the blood pressure. 7. Reduce sodium intake to less than 100 mmol/day (less than 2.3 gm of sodium or less than 6 gm of sodium choride)   This patient was seen by Leretha Pol, FNP- C in Collaboration with Dr Lavera Guise as a part of collaborative care agreement   Orders Placed This Encounter  Procedures  . Urinalysis, Routine w reflex microscopic  . Ambulatory referral to Neurology       Time spent: [place], MD  Internal Medicine

## 2018-02-11 LAB — URINALYSIS, ROUTINE W REFLEX MICROSCOPIC
Bilirubin, UA: NEGATIVE
GLUCOSE, UA: NEGATIVE
Ketones, UA: NEGATIVE
Leukocytes, UA: NEGATIVE
NITRITE UA: NEGATIVE
PROTEIN UA: NEGATIVE
RBC, UA: NEGATIVE
Specific Gravity, UA: >=1.03 — AB (ref 1.005–1.030)
Urobilinogen, Ur: 0.2 mg/dL (ref 0.2–1.0)
pH, UA: 6 (ref 5.0–7.5)

## 2018-03-11 ENCOUNTER — Other Ambulatory Visit: Payer: Self-pay | Admitting: Internal Medicine

## 2018-03-11 MED ORDER — METOPROLOL TARTRATE 25 MG PO TABS: ORAL_TABLET | ORAL | 1 refills | Status: DC

## 2018-03-17 DIAGNOSIS — R251 Tremor, unspecified: Secondary | ICD-10-CM | POA: Insufficient documentation

## 2018-04-14 DIAGNOSIS — G20 Parkinson's disease: Secondary | ICD-10-CM | POA: Insufficient documentation

## 2018-04-14 DIAGNOSIS — G20A1 Parkinson's disease without dyskinesia, without mention of fluctuations: Secondary | ICD-10-CM | POA: Insufficient documentation

## 2018-04-27 ENCOUNTER — Ambulatory Visit: Payer: 59 | Admitting: Podiatry

## 2018-04-27 ENCOUNTER — Ambulatory Visit: Payer: 59

## 2018-04-27 ENCOUNTER — Encounter: Payer: Self-pay | Admitting: Podiatry

## 2018-04-27 DIAGNOSIS — M779 Enthesopathy, unspecified: Principal | ICD-10-CM

## 2018-04-27 DIAGNOSIS — Q828 Other specified congenital malformations of skin: Secondary | ICD-10-CM | POA: Diagnosis not present

## 2018-04-27 DIAGNOSIS — M778 Other enthesopathies, not elsewhere classified: Secondary | ICD-10-CM

## 2018-04-27 DIAGNOSIS — M2041 Other hammer toe(s) (acquired), right foot: Secondary | ICD-10-CM

## 2018-04-27 NOTE — Progress Notes (Signed)
  Subjective:  Patient ID: James Bush, male    DOB: [birth year],  MRN: [id number] HPI Chief Complaint  Patient presents with  . Callouses    Patient presents today for painful corns on right 4th toe x 6-7 months.  He reports the corn keep coming back after he scrapes off.  He has been using pads with some help    57 y.o. male presents with the above complaint.   ROS: He denies fever chills nausea vomiting muscle aches pains calf pain back pain chest pain shortness of breath.  Past Medical History:  Diagnosis Date  . Asthma   . Heart murmur   . Hyperlipidemia   . Hypertension    Past Surgical History:  Procedure Laterality Date  . ablasion    . KNEE ARTHROPLASTY Right     Current Outpatient Medications:  .  albuterol (PROVENTIL HFA;VENTOLIN HFA) 108 (90 Base) MCG/ACT inhaler, Inhale into the lungs., Disp: , Rfl:  .  aspirin 81 MG tablet, Take 81 mg by mouth daily., Disp: , Rfl:  .  atorvastatin (LIPITOR) 40 MG tablet, Take by mouth., Disp: , Rfl:  .  Carbidopa-Levodopa ER (SINEMET CR) 25-100 MG tablet controlled release, Take 1 tablet by mouth 2 (two) times daily., Disp: , Rfl: 2 .  hydrochlorothiazide (HYDRODIURIL) 12.5 MG tablet, TAKE 1 TABLET(S) BY MOUTH DAILY FOR BLOOD PRESSURE, Disp: 90 tablet, Rfl: 2 .  metoprolol tartrate (LOPRESSOR) 25 MG tablet, TAKE 1 TABLET(S) BY MOUTH TWICE A DAY FOR BLOOD PRESSURE, Disp: 180 tablet, Rfl: 1 .  simvastatin (ZOCOR) 40 MG tablet, TAKE 1 TABLET BY MOUTH EVERY DAY AT NIGHT, Disp: 90 tablet, Rfl: 2  No Known Allergies Review of Systems Objective:  There were no vitals filed for this visit.  General: Well developed, nourished, in no acute distress, alert and oriented x3   Dermatological: Skin is warm, dry and supple bilateral. Nails x 10 are well maintained; remaining integument appears unremarkable at this time. There are no open sores, no preulcerative lesions, no rash or signs of infection present.  Reactive hyperkeratosis to the  plantar lateral aspect of the fourth digit right foot at the PIPJ.  This is associated with the juxtaposition of the fifth toe.  No open lesions or wounds.  Vascular: Dorsalis Pedis artery and Posterior Tibial artery pedal pulses are 2/4 bilateral with immedate capillary fill time. Pedal hair growth present. No varicosities and no lower extremity edema present bilateral.   Neruologic: Grossly intact via light touch bilateral. Vibratory intact via tuning fork bilateral. Protective threshold with Semmes Wienstein monofilament intact to all pedal sites bilateral. Patellar and Achilles deep tendon reflexes 2+ bilateral. No Babinski or clonus noted bilateral.   Musculoskeletal: No gross boney pedal deformities bilateral. No pain, crepitus, or limitation noted with foot and ankle range of motion bilateral. Muscular strength 5/5 in all groups tested bilateral.  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  Gait: Unassisted, Nonantalgic.    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  Plan: Discussed etiology pathology conservative versus surgical therapies.  I did recommend surgical intervention however today I debrided the reactive hyperkeratosis and placed silicone padding.  I will follow-up with him at the first of the year for surgical consideration.     Bayler Gehrig T. [place], [place]

## 2018-07-07 ENCOUNTER — Other Ambulatory Visit: Payer: 59

## 2018-07-11 ENCOUNTER — Other Ambulatory Visit: Payer: 59

## 2018-07-12 ENCOUNTER — Encounter: Payer: Self-pay | Admitting: Urology

## 2018-07-12 ENCOUNTER — Other Ambulatory Visit: Payer: 59

## 2018-07-13 ENCOUNTER — Other Ambulatory Visit: Payer: 59

## 2018-07-13 DIAGNOSIS — C61 Malignant neoplasm of prostate: Secondary | ICD-10-CM

## 2018-07-14 ENCOUNTER — Encounter: Payer: Self-pay | Admitting: Urology

## 2018-07-14 ENCOUNTER — Other Ambulatory Visit: Payer: Self-pay

## 2018-07-14 ENCOUNTER — Ambulatory Visit: Payer: 59 | Admitting: Urology

## 2018-07-14 VITALS — BP 140/86 | HR 66 | Ht 67.0 in | Wt 213.0 lb

## 2018-07-14 DIAGNOSIS — C61 Malignant neoplasm of prostate: Secondary | ICD-10-CM

## 2018-07-14 LAB — PSA: Prostate Specific Ag, Serum: 6.7 ng/mL — ABNORMAL HIGH (ref 0.0–4.0)

## 2018-07-14 NOTE — Progress Notes (Signed)
   07/14/2018 3:35 PM   James Bush Jul 09, 1961 [id number]  Reason for visit: Follow up very low risk prostate cancer  HPI: I had the pleasure of meeting Mr. James Bush in urology clinic today for discussion of his very low risk prostate cancer.  Briefly he is a healthy 57 year old African-American male who was diagnosed with very low risk prostate cancer in March 2017.  He does have a family history of non-lethal prostate cancer in his uncle.  PSA at time of diagnosis was 6.25, and biopsy showed 1/12 cores positive for Gleason score 3+3= 6 disease.  Confirmatory biopsy in October 2017 showed 2/12 cores of 3+3=6 disease, with max core involvement of 33%.  Prostate volume is 53 g, with PSA density of 0.13.  PSA today is stable at 6.7 from 7.6 six months ago, and 7.1 one year ago.  He denies any bone pain or weight loss.  He has very mild urinary symptoms with weak stream in the morning, but is not interested in taking medications.   ROS: Please see flowsheet from today's date for complete review of systems.  Physical Exam: BP 140/86   Pulse 66   Ht 5\' 7"  (1.702 m)   Wt 213 lb (96.6 kg)   BMI 33.36 kg/m    Constitutional:  Alert and oriented, No acute distress. Respiratory: Normal respiratory effort, no increased work of breathing. GI: Abdomen is soft, nontender, nondistended, no abdominal masses GU: No CVA tenderness Skin: No rashes, bruises or suspicious lesions. Neurologic: Grossly intact, no focal deficits, moving all 4 extremities. Psychiatric: Normal mood and affect  Laboratory Data: PSA history reviewed  Pertinent Imaging: None to review  Assessment & Plan:   In summary, Mr. James Bush is a 57 year old healthy African-American male with very low risk prostate cancer on active surveillance.  PSA is stable at 6.7 from 7.1 last year.  He remains asymptomatic.    -Continue active surveillance -Follow-up in 6 months with PSA and DRE.  Consider prostate MRI versus repeat prostate biopsy  in 18 to 24 months.  Return in about 6 months (around 01/12/2019) for PSA/DRE.  Billey Co, [facility] [address], [place], [place] [address] [phone]

## 2018-08-18 ENCOUNTER — Encounter: Payer: Self-pay | Admitting: Nurse Practitioner

## 2018-08-18 ENCOUNTER — Ambulatory Visit: Payer: No Typology Code available for payment source | Admitting: Nurse Practitioner

## 2018-08-18 VITALS — BP 145/96 | HR 74 | Resp 16 | Ht 70.0 in | Wt 218.2 lb

## 2018-08-18 DIAGNOSIS — R0609 Other forms of dyspnea: Secondary | ICD-10-CM | POA: Insufficient documentation

## 2018-08-18 DIAGNOSIS — R0602 Shortness of breath: Secondary | ICD-10-CM | POA: Diagnosis not present

## 2018-08-18 DIAGNOSIS — E782 Mixed hyperlipidemia: Secondary | ICD-10-CM

## 2018-08-18 DIAGNOSIS — G20 Parkinson's disease: Secondary | ICD-10-CM

## 2018-08-18 DIAGNOSIS — I10 Essential (primary) hypertension: Secondary | ICD-10-CM

## 2018-08-18 NOTE — Progress Notes (Signed)
[facility] [place], [place] [address]  Internal MEDICINE  Office Visit Note  Patient Name: James Bush  [id number]  [id number]  Date of Service: 08/18/2018  Chief Complaint  Patient presents with  . Medical Management of Chronic Issues    6 month follow up  . Hypertension  . Hyperlipidemia  . Shortness of Breath    has been going on for about 8 months seen a specialist, and just recently it has started again it only happens when pt starts talking, mentioned that he once had asmtha     The patient is here for routine follow up exam. Today, he states that he has been having intermittent shortness of breath for past eight months or so. Really has resolved these past two to three weeks. States that talking is the activity which would really cause the shortness of breath. He was started on Sinemet in July due to new diagnosis of Parkinson's disease. He is seeing neurology for this. Doing well with the medication. Goes back for follow up in February, 2020. He is due to have routine, fasting labs checked.       Current Medication: Outpatient Encounter Medications as of 08/18/2018  Medication Sig  . albuterol (PROVENTIL HFA;VENTOLIN HFA) 108 (90 Base) MCG/ACT inhaler Inhale into the lungs.  Marland Kitchen aspirin 81 MG tablet Take 81 mg by mouth daily.  Marland Kitchen atorvastatin (LIPITOR) 40 MG tablet Take by mouth.  . Carbidopa-Levodopa ER (SINEMET CR) 25-100 MG tablet controlled release Take 1 tablet by mouth 2 (two) times daily.  . hydrochlorothiazide (HYDRODIURIL) 12.5 MG tablet TAKE 1 TABLET(S) BY MOUTH DAILY FOR BLOOD PRESSURE  . metoprolol tartrate (LOPRESSOR) 25 MG tablet TAKE 1 TABLET(S) BY MOUTH TWICE A DAY FOR BLOOD PRESSURE  . simvastatin (ZOCOR) 40 MG tablet TAKE 1 TABLET BY MOUTH EVERY DAY AT NIGHT   No facility-administered encounter medications on file as of 08/18/2018.     Surgical History: Past Surgical History:  Procedure Laterality Date  . ablasion    . KNEE  ARTHROPLASTY Right     Medical History: Past Medical History:  Diagnosis Date  . Asthma   . Heart murmur   . Hyperlipidemia   . Hypertension     Family History: Family History  Problem Relation Age of Onset  . Hematuria Neg Hx   . Kidney cancer Neg Hx   . Prostate cancer Neg Hx     Social History   Socioeconomic History  . Marital status: Married    Spouse name: Not on file  . Number of children: Not on file  . Years of education: Not on file  . Highest education level: Not on file  Occupational History  . Not on file  Social Needs  . Financial resource strain: Not on file  . Food insecurity:    Worry: Not on file    Inability: Not on file  . Transportation needs:    Medical: Not on file    Non-medical: Not on file  Tobacco Use  . Smoking status: Never Smoker  . Smokeless tobacco: Never Used  Substance and Sexual Activity  . Alcohol use: No    Alcohol/week: 0.0 standard drinks  . Drug use: No  . Sexual activity: Not on file  Lifestyle  . Physical activity:    Days per week: Not on file    Minutes per session: Not on file  . Stress: Not on file  Relationships  . Social connections:  Talks on phone: Not on file    Gets together: Not on file    Attends religious service: Not on file    Active member of club or organization: Not on file    Attends meetings of clubs or organizations: Not on file    Relationship status: Not on file  . Intimate partner violence:    Fear of current or ex partner: Not on file    Emotionally abused: Not on file    Physically abused: Not on file    Forced sexual activity: Not on file  Other Topics Concern  . Not on file  Social History Narrative  . Not on file      Review of Systems  Constitutional: Positive for activity change. Negative for chills, fatigue and unexpected weight change.  HENT: Negative for congestion, postnasal drip, rhinorrhea, sneezing and sore throat.   Eyes: Negative.   Respiratory: Positive for  shortness of breath. Negative for cough and chest tightness.   Cardiovascular: Negative for chest pain and palpitations.  Gastrointestinal: Negative for abdominal pain, constipation, diarrhea, nausea and vomiting.  Endocrine: Negative for cold intolerance, heat intolerance, polydipsia, polyphagia and polyuria.  Genitourinary: Negative.  Negative for dysuria and frequency.       Patient sees urology for elevated PSA levels which are improving.   Musculoskeletal: Positive for arthralgias. Negative for back pain, joint swelling and neck pain.       Right upper leg aches  Skin: Negative for rash.  Allergic/Immunologic: Negative for environmental allergies.  Neurological: Positive for tremors and weakness. Negative for numbness.       Right sided upper and lower extremity weakness. Tremor present in right hand with exertion.   Hematological: Negative for adenopathy. Does not bruise/bleed easily.  Psychiatric/Behavioral: Negative for behavioral problems (Depression), dysphoric mood, sleep disturbance and suicidal ideas. The patient is not nervous/anxious.     Vital Signs: BP (!) 145/96 (BP Location: Right Arm, Patient Position: Sitting, Cuff Size: Large)   Pulse 74   Resp 16   Ht 5\' 10"  (1.778 m)   Wt 218 lb 3.2 oz (99 kg)   SpO2 97%   BMI 31.31 kg/m    Physical Exam Vitals signs and nursing note reviewed.  Constitutional:      General: He is not in acute distress.    Appearance: Normal appearance. He is well-developed. He is not diaphoretic.  HENT:     Head: Normocephalic and atraumatic.     Nose: Nose normal.     Mouth/Throat:     Pharynx: No oropharyngeal exudate.  Eyes:     Conjunctiva/sclera: Conjunctivae normal.     Pupils: Pupils are equal, round, and reactive to light.  Neck:     Musculoskeletal: Normal range of motion and neck supple.     Thyroid: No thyromegaly.     Vascular: No carotid bruit or JVD.     Trachea: No tracheal deviation.  Cardiovascular:     Rate and  Rhythm: Normal rate and regular rhythm.     Pulses: Normal pulses.     Heart sounds: Normal heart sounds. No murmur. No friction rub. No gallop.   Pulmonary:     Effort: Pulmonary effort is normal. No respiratory distress.     Breath sounds: Normal breath sounds. No wheezing or rales.  Chest:     Chest wall: No tenderness.  Abdominal:     General: Bowel sounds are normal.     Palpations: Abdomen is soft.  Tenderness: There is no abdominal tenderness.  Musculoskeletal: Normal range of motion.  Lymphadenopathy:     Cervical: No cervical adenopathy.  Skin:    General: Skin is warm and dry.     Capillary Refill: Capillary refill takes less than 2 seconds.  Neurological:     Mental Status: He is alert and oriented to person, place, and time. Mental status is at baseline.     Cranial Nerves: No cranial nerve deficit.     Comments: Pupils are equal, round, and reactive to light and accomodation. Cranial nerves showing mild asymmetry on the right side of the face and right accessory nerve. Strength in right arm is slightly diminished from left .  Psychiatric:        Behavior: Behavior normal.        Thought Content: Thought content normal.        Judgment: Judgment normal.   Assessment/Plan: 1. Shortness of breath Though improving, will get echocardiogram for further evaluation. Review at next visit.  - ECHOCARDIOGRAM COMPLETE; Future  2. Essential hypertension Stable. Continue bp medication as prescribed. Routine, fasting labs ordered today.  - CBC with Differential/Platelet - Comprehensive metabolic panel - TSH  3. Mixed hyperlipidemia Check fasting lipid panel and adjust atorvastatin as indicated.  - Comprehensive metabolic panel - Lipid panel - TSH  4. Parkinson's disease (HCC) Regular visits with neurology as scheduled.   General Counseling: wilmont olund understanding of the findings of todays visit and agrees with plan of treatment. I have discussed any further  diagnostic evaluation that may be needed or ordered today. We also reviewed his medications today. he has been encouraged to call the office with any questions or concerns that should arise related to todays visit.  Hypertension Counseling:   The following hypertensive lifestyle modification were recommended and discussed:  1. Limiting alcohol intake to less than 1 oz/day of ethanol:(24 oz of beer or 8 oz of wine or 2 oz of 100-proof whiskey). 2. Take baby ASA 81 mg daily. 3. Importance of regular aerobic exercise and losing weight. 4. Reduce dietary saturated fat and cholesterol intake for overall cardiovascular health. 5. Maintaining adequate dietary potassium, calcium, and magnesium intake. 6. Regular monitoring of the blood pressure. 7. Reduce sodium intake to less than 100 mmol/day (less than 2.3 gm of sodium or less than 6 gm of sodium choride)   This patient was seen by [place] with Dr Lavera Guise as a part of collaborative care agreement  Orders Placed This Encounter  Procedures  . CBC with Differential/Platelet  . Comprehensive metabolic panel  . Lipid panel  . TSH  . ECHOCARDIOGRAM COMPLETE     Time spent: 25 Minutes      Dr Lavera Guise Internal medicine

## 2018-08-30 ENCOUNTER — Other Ambulatory Visit: Payer: Self-pay | Admitting: Internal Medicine

## 2018-09-02 ENCOUNTER — Ambulatory Visit: Payer: No Typology Code available for payment source

## 2018-09-02 ENCOUNTER — Other Ambulatory Visit: Payer: Self-pay | Admitting: Internal Medicine

## 2018-09-02 DIAGNOSIS — R0602 Shortness of breath: Secondary | ICD-10-CM

## 2018-09-15 ENCOUNTER — Ambulatory Visit: Payer: No Typology Code available for payment source | Admitting: Nurse Practitioner

## 2018-09-15 ENCOUNTER — Encounter: Payer: Self-pay | Admitting: Nurse Practitioner

## 2018-09-15 VITALS — BP 133/87 | HR 74 | Resp 16 | Ht 70.0 in | Wt 218.6 lb

## 2018-09-15 DIAGNOSIS — I10 Essential (primary) hypertension: Secondary | ICD-10-CM

## 2018-09-15 DIAGNOSIS — G20 Parkinson's disease: Secondary | ICD-10-CM

## 2018-09-15 DIAGNOSIS — R0602 Shortness of breath: Secondary | ICD-10-CM

## 2018-09-15 NOTE — Progress Notes (Signed)
[facility] [place], [place] [address]  Internal MEDICINE  Office Visit Note  Patient Name: James Bush  [id number]  [id number]  Date of Service: 09/15/2018  Chief Complaint  Patient presents with  . Labs Only    2wk follow up Ultrasound results    The patient is here for routine follow up exam. He had been having intermittent shortness of breath for past eight months or so. Really has resolved over the past month. States that talking is the activity which would really cause the shortness of breath. States that he is really no longer having this issue. Will act up once every three weeks or so, but with rest, symptoms resolve. He had echocardiogram to evaluate heart function. Showed diastolic dysfunction and valvular regurgitation, but no acute abnormalities. Ejection fraction was normal. He has no new concerns or complaints to discuss today.       Current Medication: Outpatient Encounter Medications as of 09/15/2018  Medication Sig  . albuterol (PROVENTIL HFA;VENTOLIN HFA) 108 (90 Base) MCG/ACT inhaler Inhale into the lungs.  Marland Kitchen aspirin 81 MG tablet Take 81 mg by mouth daily.  Marland Kitchen atorvastatin (LIPITOR) 40 MG tablet Take by mouth.  . Carbidopa-Levodopa ER (SINEMET CR) 25-100 MG tablet controlled release Take 1 tablet by mouth 2 (two) times daily.  . hydrochlorothiazide (HYDRODIURIL) 12.5 MG tablet TAKE 1 TABLET(S) BY MOUTH DAILY FOR BLOOD PRESSURE  . metoprolol tartrate (LOPRESSOR) 25 MG tablet TAKE 1 TABLET(S) BY MOUTH TWICE A DAY FOR BLOOD PRESSURE  . simvastatin (ZOCOR) 40 MG tablet TAKE 1 TABLET BY MOUTH EVERY DAY AT NIGHT   No facility-administered encounter medications on file as of 09/15/2018.     Surgical History: Past Surgical History:  Procedure Laterality Date  . ablasion    . KNEE ARTHROPLASTY Right     Medical History: Past Medical History:  Diagnosis Date  . Asthma   . Heart murmur   . Hyperlipidemia   . Hypertension     Family  History: Family History  Problem Relation Age of Onset  . Hematuria Neg Hx   . Kidney cancer Neg Hx   . Prostate cancer Neg Hx     Social History   Socioeconomic History  . Marital status: Married    Spouse name: Not on file  . Number of children: Not on file  . Years of education: Not on file  . Highest education level: Not on file  Occupational History  . Not on file  Social Needs  . Financial resource strain: Not on file  . Food insecurity:    Worry: Not on file    Inability: Not on file  . Transportation needs:    Medical: Not on file    Non-medical: Not on file  Tobacco Use  . Smoking status: Never Smoker  . Smokeless tobacco: Never Used  Substance and Sexual Activity  . Alcohol use: No    Alcohol/week: 0.0 standard drinks  . Drug use: No  . Sexual activity: Not on file  Lifestyle  . Physical activity:    Days per week: Not on file    Minutes per session: Not on file  . Stress: Not on file  Relationships  . Social connections:    Talks on phone: Not on file    Gets together: Not on file    Attends religious service: Not on file    Active member of club or organization: Not on file    Attends meetings of  clubs or organizations: Not on file    Relationship status: Not on file  . Intimate partner violence:    Fear of current or ex partner: Not on file    Emotionally abused: Not on file    Physically abused: Not on file    Forced sexual activity: Not on file  Other Topics Concern  . Not on file  Social History Narrative  . Not on file      Review of Systems  Constitutional: Negative for activity change, chills, fatigue and unexpected weight change.  HENT: Negative for congestion, postnasal drip, rhinorrhea, sneezing and sore throat.   Eyes: Negative.   Respiratory: Positive for shortness of breath. Negative for cough and chest tightness.        Shortness of breath has essentially resolved   Cardiovascular: Negative for chest pain and palpitations.   Gastrointestinal: Negative for abdominal pain, constipation, diarrhea, nausea and vomiting.  Endocrine: Negative for cold intolerance, heat intolerance, polydipsia and polyuria.  Genitourinary: Negative for dysuria.  Musculoskeletal: Positive for arthralgias. Negative for back pain, joint swelling and neck pain.  Skin: Negative for rash.  Allergic/Immunologic: Negative for environmental allergies.  Neurological: Positive for tremors and weakness. Negative for numbness.       Right sided upper and lower extremity weakness. Tremor present in right hand with exertion.   Hematological: Negative for adenopathy. Does not bruise/bleed easily.  Psychiatric/Behavioral: Negative for behavioral problems (Depression), dysphoric mood, sleep disturbance and suicidal ideas. The patient is not nervous/anxious.     Today's Vitals   09/15/18 1119  BP: 133/87  Pulse: 74  Resp: 16  SpO2: 94%  Weight: 218 lb 9.6 oz (99.2 kg)  Height: 5\' 10"  (1.778 m)    Physical Exam Vitals signs and nursing note reviewed.  Constitutional:      General: He is not in acute distress.    Appearance: Normal appearance. He is well-developed. He is not diaphoretic.  HENT:     Head: Normocephalic and atraumatic.     Mouth/Throat:     Pharynx: No oropharyngeal exudate.  Eyes:     Conjunctiva/sclera: Conjunctivae normal.     Pupils: Pupils are equal, round, and reactive to light.  Neck:     Musculoskeletal: Normal range of motion and neck supple.     Thyroid: No thyromegaly.     Vascular: No carotid bruit or JVD.     Trachea: No tracheal deviation.  Cardiovascular:     Rate and Rhythm: Normal rate and regular rhythm.     Pulses: Normal pulses.     Heart sounds: Normal heart sounds. No murmur. No friction rub. No gallop.   Pulmonary:     Effort: Pulmonary effort is normal. No respiratory distress.     Breath sounds: Normal breath sounds. No wheezing or rales.  Chest:     Chest wall: No tenderness.  Abdominal:      General: Bowel sounds are normal.     Palpations: Abdomen is soft.     Tenderness: There is no abdominal tenderness.  Musculoskeletal: Normal range of motion.  Lymphadenopathy:     Cervical: No cervical adenopathy.  Skin:    General: Skin is warm and dry.     Capillary Refill: Capillary refill takes less than 2 seconds.  Neurological:     Mental Status: He is alert and oriented to person, place, and time. Mental status is at baseline.     Cranial Nerves: No cranial nerve deficit.  Psychiatric:  Behavior: Behavior normal.        Thought Content: Thought content normal.        Judgment: Judgment normal.   Assessment/Plan: 1. Shortness of breath  Resolved. Reviewed echocardiogram with the patient. Showed diastolic dysfunction and valvular regurgitation, but no acute abnormalities. Will repeat study in one year for continued evaluation.  2. Essential hypertension Stable. Continue bp medication as prescribed   3. Parkinson's disease ([place]) Continue sinemet as prescribed. Regular visits with neurology as scheduled.   General Counseling: treven holtman understanding of the findings of todays visit and agrees with plan of treatment. I have discussed any further diagnostic evaluation that may be needed or ordered today. We also reviewed his medications today. he has been encouraged to call the office with any questions or concerns that should arise related to todays visit.   Cardiac risk factor modification:  1. Control blood pressure. 2. Exercise as prescribed. 3. Follow low sodium, low fat diet. and low fat and low cholestrol diet. 4. Take ASA 81mg  once a day. 5. Restricted calories diet to lose weight.  This patient was seen by Leretha Pol FNP Collaboration with Dr Lavera Guise as a part of collaborative care agreement   Time spent: 25 Minutes      Dr Lavera Guise Internal medicine

## 2018-10-20 DIAGNOSIS — I10 Essential (primary) hypertension: Secondary | ICD-10-CM | POA: Insufficient documentation

## 2018-12-10 IMAGING — CR DG SHOULDER 2+V*R*
1 series · 3 of 3 positions shown · non-contrast
Comparison: None.

CLINICAL DATA: Pain radiating down the right arm for 8 to 10
months, worsening.

EXAM:
RIGHT SHOULDER - 2+ VIEW

[Series 1: dg shoulder right · 0.14mm/px · 3 of 3 slices shown]
[im 1/3]
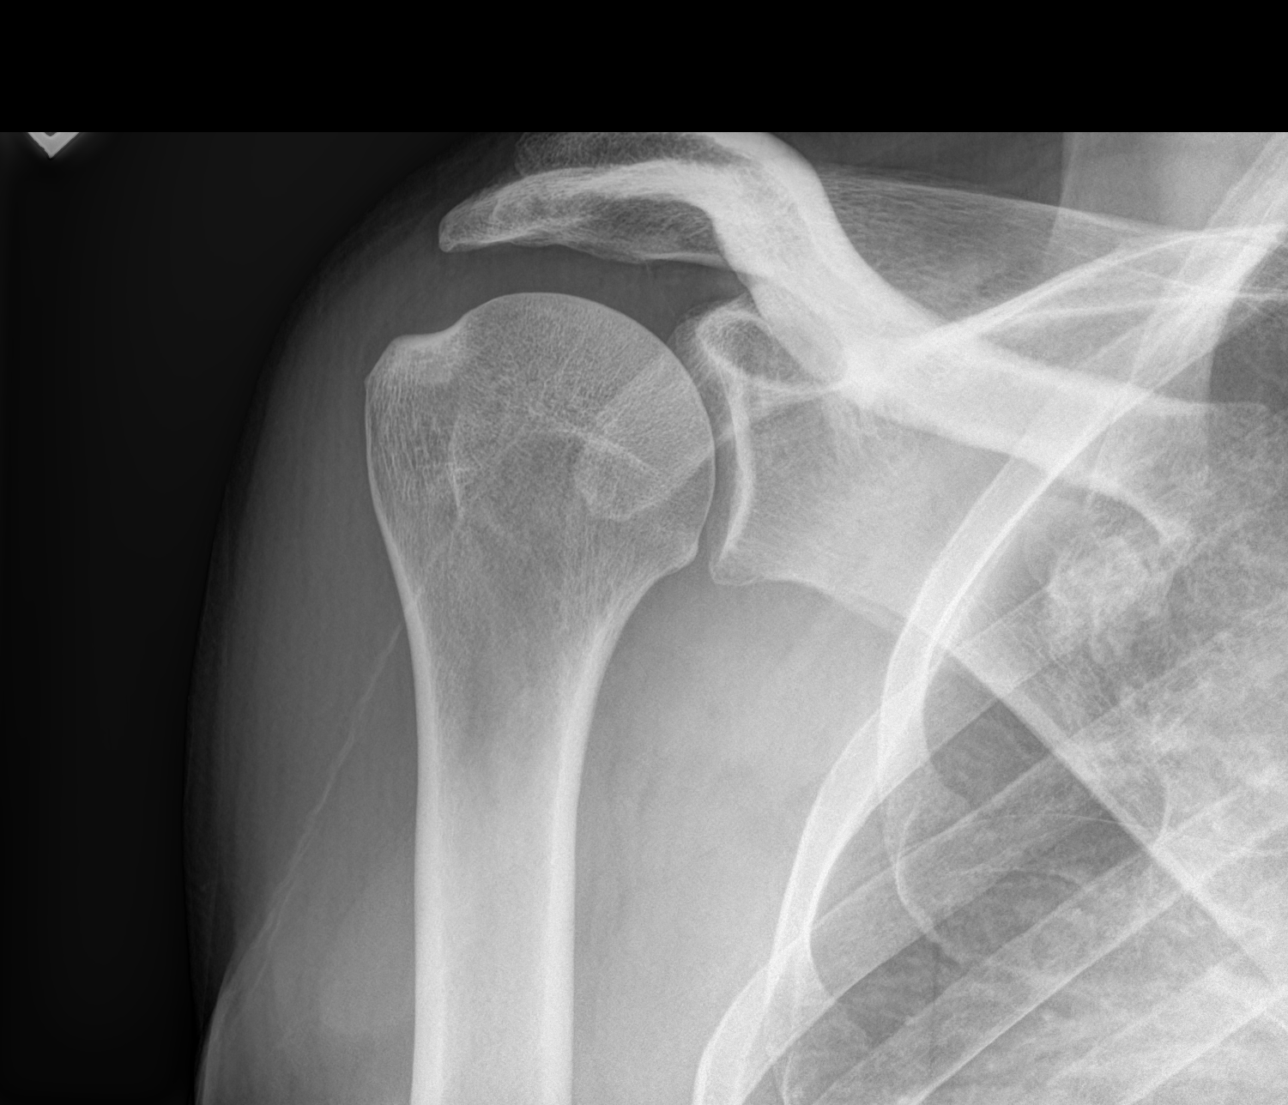
[im 2/3]
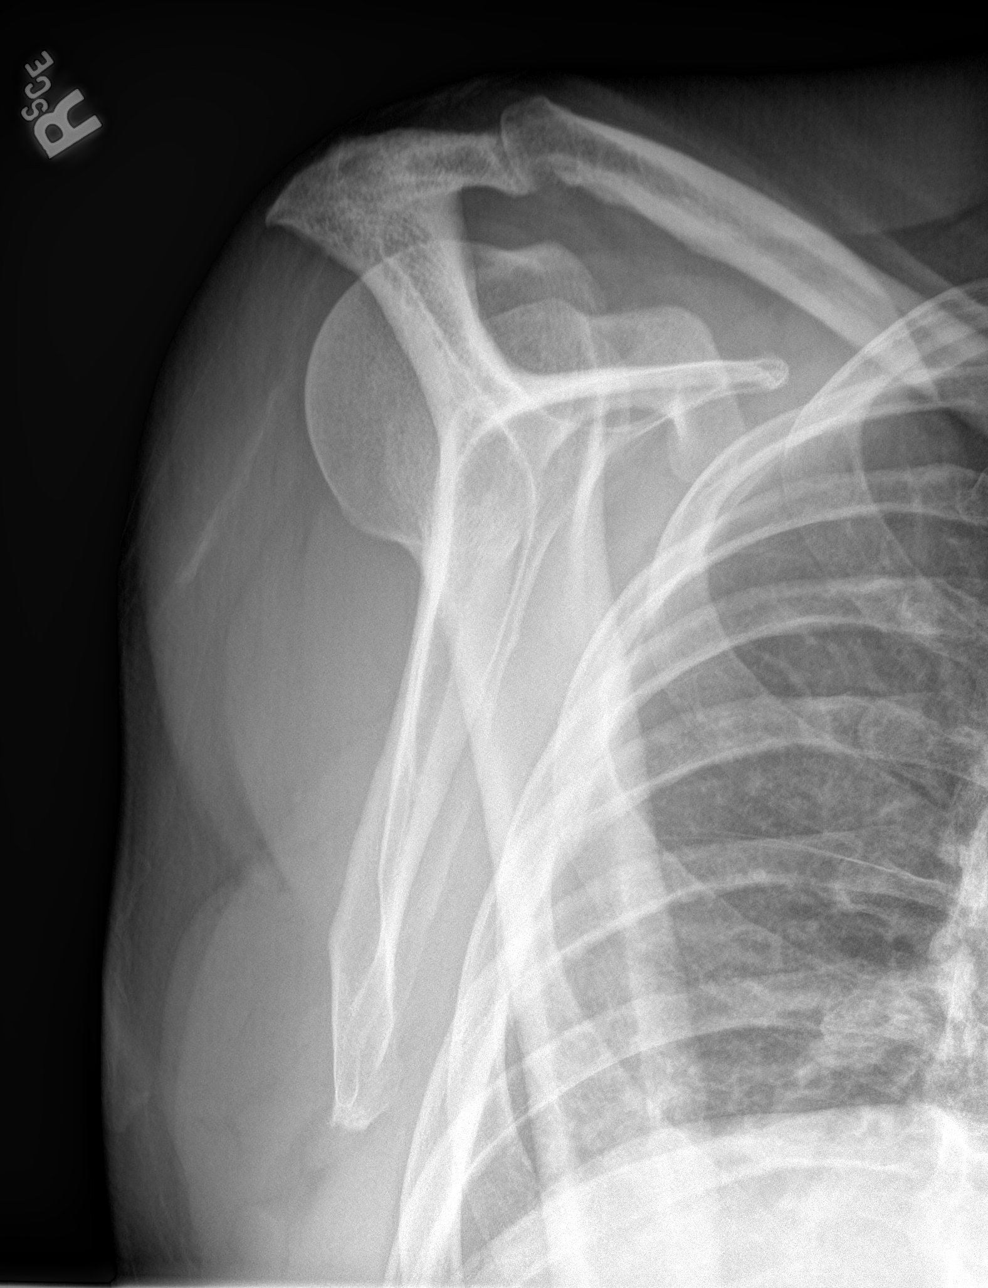
[im 3/3]
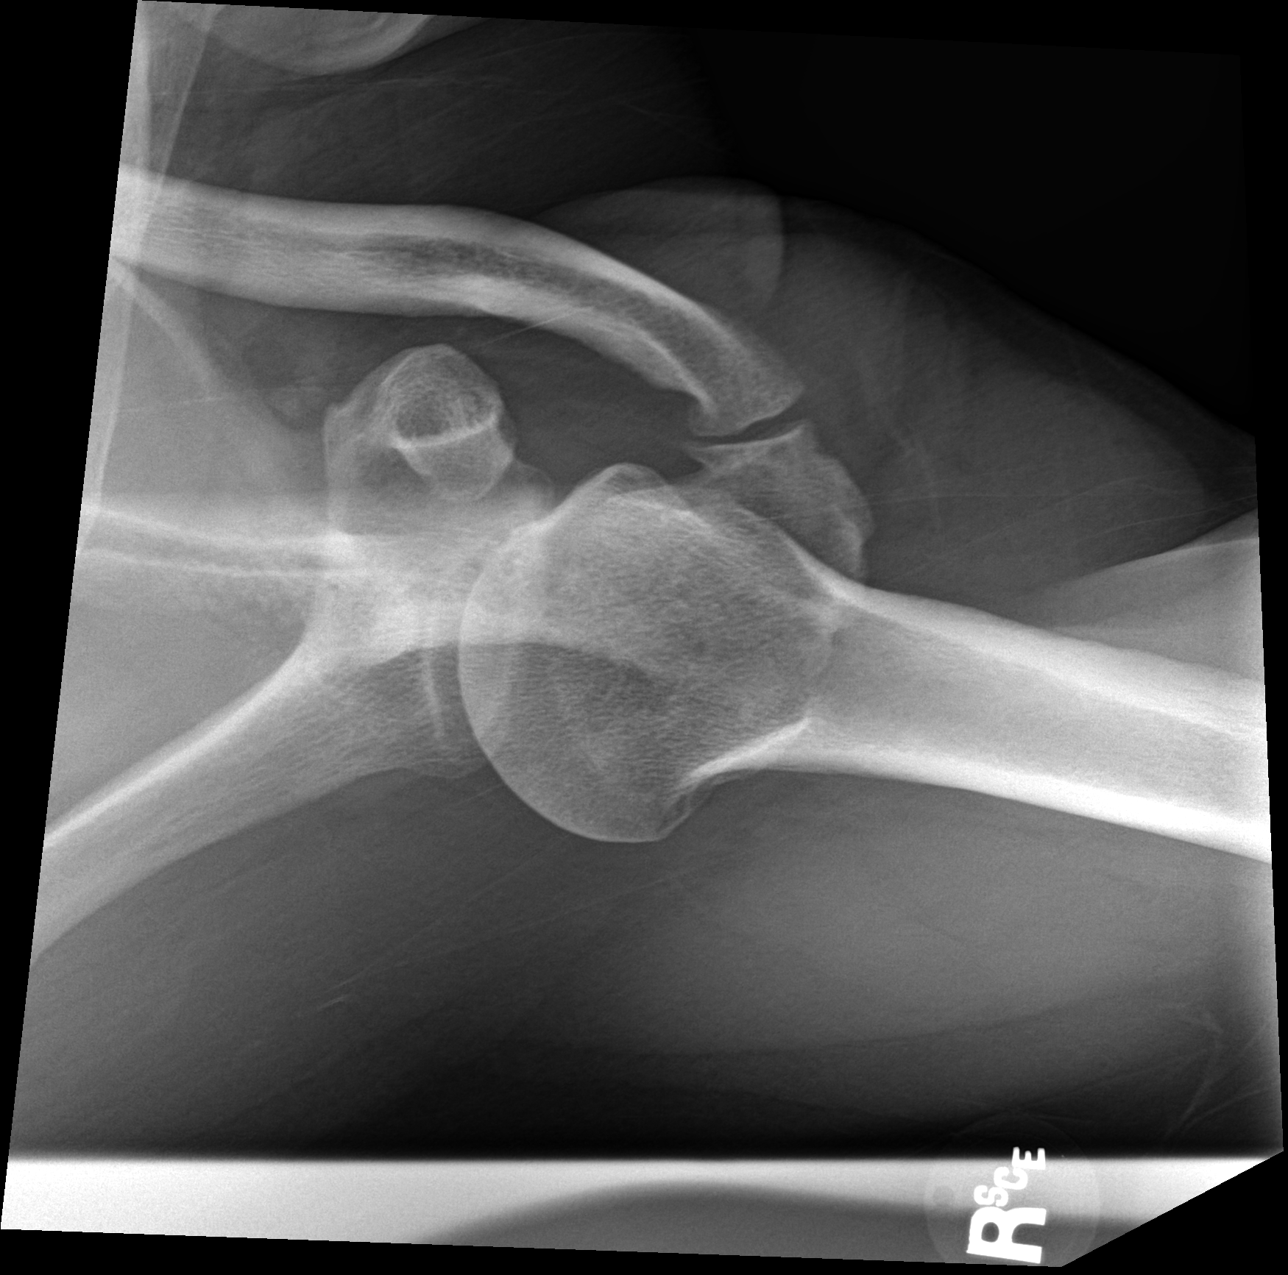

[3 of 3 positions shown; findings below may reference images not displayed]

FINDINGS: Borderline posterior spurring at the acromioclavicular joint without
suspected joint narrowing. No glenohumeral joint narrowing or
spurring. No fracture deformity or evidence of bone lesion. No soft
tissue calcification.
IMPRESSION: No degenerative joint narrowing or glenohumeral spurring.

## 2019-01-09 ENCOUNTER — Other Ambulatory Visit: Payer: 59

## 2019-01-12 ENCOUNTER — Ambulatory Visit: Payer: 59 | Admitting: Urology

## 2019-02-16 ENCOUNTER — Other Ambulatory Visit: Payer: Self-pay

## 2019-02-16 ENCOUNTER — Ambulatory Visit: Payer: 59 | Admitting: Nurse Practitioner

## 2019-02-16 ENCOUNTER — Encounter: Payer: Self-pay | Admitting: Nurse Practitioner

## 2019-02-16 VITALS — BP 145/80 | HR 70 | Resp 16 | Ht 70.0 in | Wt 214.0 lb

## 2019-02-16 DIAGNOSIS — R0602 Shortness of breath: Secondary | ICD-10-CM

## 2019-02-16 DIAGNOSIS — R300 Dysuria: Secondary | ICD-10-CM | POA: Diagnosis not present

## 2019-02-16 DIAGNOSIS — G20 Parkinson's disease: Secondary | ICD-10-CM | POA: Diagnosis not present

## 2019-02-16 DIAGNOSIS — I10 Essential (primary) hypertension: Secondary | ICD-10-CM | POA: Diagnosis not present

## 2019-02-16 DIAGNOSIS — Z0001 Encounter for general adult medical examination with abnormal findings: Secondary | ICD-10-CM | POA: Diagnosis not present

## 2019-02-16 MED ORDER — ALBUTEROL SULFATE HFA 108 (90 BASE) MCG/ACT IN AERS: 2.0000 | INHALATION_SPRAY | Freq: Four times a day (QID) | RESPIRATORY_TRACT | 5 refills | Status: DC | PRN

## 2019-02-16 NOTE — Progress Notes (Signed)
[facility] [place], [place] [address]  Internal MEDICINE  Office Visit Note  Patient Name: James Bush  [id number]  [id number]  Date of Service: 02/16/2019   Pt is here for routine health maintenance examination  Chief Complaint  Patient presents with  . Annual Exam  . Hypertension     The patient os here for health maintenance exam. He states that he does have intermittent shortness of breath. He uses ventolin inhaler when this happens which improves the symptoms. He states that the last time he had to use the inhaler was about a month ago. He is seeing neurologist, recently diagnosed with Parkinson's disease. Started on low dose Sinemet. He states that it makes him a little sleepy, but otherwise, he is doing well with it and feels like it is helping. He is also seeing urologist due to elevated PSA levels. He is due to have labs checked later this month. He has no new concerns or complaints today.     Current Medication: Outpatient Encounter Medications as of 02/16/2019  Medication Sig  . albuterol (VENTOLIN HFA) 108 (90 Base) MCG/ACT inhaler Inhale 2 puffs into the lungs every 6 (six) hours as needed for wheezing or shortness of breath.  Marland Kitchen aspirin 81 MG tablet Take 81 mg by mouth daily.  Marland Kitchen atorvastatin (LIPITOR) 40 MG tablet Take by mouth.  . Carbidopa-Levodopa ER (SINEMET CR) 25-100 MG tablet controlled release Take 1 tablet by mouth 2 (two) times daily.  . hydrochlorothiazide (HYDRODIURIL) 12.5 MG tablet TAKE 1 TABLET(S) BY MOUTH DAILY FOR BLOOD PRESSURE  . metoprolol tartrate (LOPRESSOR) 25 MG tablet TAKE 1 TABLET(S) BY MOUTH TWICE A DAY FOR BLOOD PRESSURE  . simvastatin (ZOCOR) 40 MG tablet TAKE 1 TABLET BY MOUTH EVERY DAY AT NIGHT  . [DISCONTINUED] albuterol (PROVENTIL HFA;VENTOLIN HFA) 108 (90 Base) MCG/ACT inhaler Inhale into the lungs.   No facility-administered encounter medications on file as of 02/16/2019.     Surgical History: Past  Surgical History:  Procedure Laterality Date  . ablasion    . KNEE ARTHROPLASTY Right     Medical History: Past Medical History:  Diagnosis Date  . Asthma   . Heart murmur   . Hyperlipidemia   . Hypertension     Family History: Family History  Problem Relation Age of Onset  . Hematuria Neg Hx   . Kidney cancer Neg Hx   . Prostate cancer Neg Hx       Review of Systems  Constitutional: Negative for activity change, chills, fatigue and unexpected weight change.  HENT: Negative for congestion, postnasal drip, rhinorrhea, sneezing and sore throat.   Respiratory: Positive for shortness of breath. Negative for cough and chest tightness.        Intermittent shortness of breath. Uses albuterol when needed. Last used inhaler about a month ago.   Cardiovascular: Negative for chest pain and palpitations.  Gastrointestinal: Negative for abdominal pain, constipation, diarrhea, nausea and vomiting.  Endocrine: Negative for cold intolerance, heat intolerance, polydipsia and polyuria.  Genitourinary: Negative for dysuria.  Musculoskeletal: Negative for arthralgias, back pain, joint swelling and neck pain.  Skin: Negative for rash.  Allergic/Immunologic: Negative for environmental allergies.  Neurological: Positive for tremors and weakness. Negative for numbness.       Right sided upper and lower extremity weakness. Tremor present in right hand with exertion.  The patient is now seeing neurology and diagnosed with parkinson's disease.   Hematological: Negative for adenopathy. Does not bruise/bleed easily.  Psychiatric/Behavioral: Negative for behavioral problems (Depression), dysphoric mood, sleep disturbance and suicidal ideas. The patient is not nervous/anxious.    Today's Vitals   02/16/19 0857  BP: (!) 145/80  Pulse: 70  Resp: 16  SpO2: 98%  Weight: 214 lb (97.1 kg)  Height: 5\' 10"  (1.778 m)   Body mass index is 30.71 kg/m.  Physical Exam Vitals signs and nursing note  reviewed.  Constitutional:      General: He is not in acute distress.    Appearance: Normal appearance. He is well-developed. He is not diaphoretic.  HENT:     Head: Normocephalic and atraumatic.     Mouth/Throat:     Pharynx: No oropharyngeal exudate.  Eyes:     Conjunctiva/sclera: Conjunctivae normal.     Pupils: Pupils are equal, round, and reactive to light.  Neck:     Musculoskeletal: Normal range of motion and neck supple.     Thyroid: No thyromegaly.     Vascular: No carotid bruit or JVD.     Trachea: No tracheal deviation.  Cardiovascular:     Rate and Rhythm: Normal rate and regular rhythm.     Pulses: Normal pulses.     Heart sounds: Normal heart sounds. No murmur. No friction rub. No gallop.   Pulmonary:     Effort: Pulmonary effort is normal. No respiratory distress.     Breath sounds: Normal breath sounds. No wheezing or rales.  Chest:     Chest wall: No tenderness.  Abdominal:     General: Bowel sounds are normal.     Palpations: Abdomen is soft.     Tenderness: There is no abdominal tenderness.  Musculoskeletal: Normal range of motion.  Lymphadenopathy:     Cervical: No cervical adenopathy.  Skin:    General: Skin is warm and dry.     Capillary Refill: Capillary refill takes less than 2 seconds.  Neurological:     Mental Status: He is alert and oriented to person, place, and time. Mental status is at baseline.     Cranial Nerves: No cranial nerve deficit.  Psychiatric:        Behavior: Behavior normal.        Thought Content: Thought content normal.        Judgment: Judgment normal.    Depression screen [facility] 2/9 02/16/2019 09/15/2018 08/18/2018 02/10/2018  Decreased Interest 0 0 0 0  Down, Depressed, Hopeless 0 0 0 0  PHQ - 2 Score 0 0 0 0    Functional Status Survey: Is the patient deaf or have difficulty hearing?: No Does the patient have difficulty seeing, even when wearing glasses/contacts?: No Does the patient have difficulty concentrating,  remembering, or making decisions?: No Does the patient have difficulty walking or climbing stairs?: No Does the patient have difficulty dressing or bathing?: No Does the patient have difficulty doing errands alone such as visiting a doctor's office or shopping?: No  MMSE - [place] Exam 02/16/2019  Orientation to time 5  Orientation to Place 5  Registration 3  Attention/ Calculation 5  Recall 3  Language- name 2 objects 2  Language- repeat 1  Language- follow 3 step command 3  Language- read & follow direction 1  Write a sentence 0  Copy design 1  Total score 29    Fall Risk  02/16/2019 09/15/2018 08/18/2018 02/10/2018  Falls in the past year? 0 0 0 No   Assessment/Plan: 1. Encounter for general adult medical examination with abnormal findings Annual  health maintenance exam today.  2. Shortness of breath May continue to take albuterol rescue inhaler as needed and as prescribed. New prescription sent to pharmacy.  - albuterol (VENTOLIN HFA) 108 (90 Base) MCG/ACT inhaler; Inhale 2 puffs into the lungs every 6 (six) hours as needed for wheezing or shortness of breath.  Dispense: 18 g; Refill: 5  3. Essential hypertension Stable.   4. Parkinson's disease [facility]) Patient now seeing neurology and started on Sinemet.   5. Dysuria - UA/M w/rflx Culture, Routine  General Counseling: Neng verbalizes understanding of the findings of todays visit and agrees with plan of treatment. I have discussed any further diagnostic evaluation that may be needed or ordered today. We also reviewed his medications today. he has been encouraged to call the office with any questions or concerns that should arise related to todays visit.    Counseling:  Hypertension Counseling:   The following hypertensive lifestyle modification were recommended and discussed:  1. Limiting alcohol intake to less than 1 oz/day of ethanol:(24 oz of beer or 8 oz of wine or 2 oz of 100-proof whiskey). 2. Take  baby ASA 81 mg daily. 3. Importance of regular aerobic exercise and losing weight. 4. Reduce dietary saturated fat and cholesterol intake for overall cardiovascular health. 5. Maintaining adequate dietary potassium, calcium, and magnesium intake. 6. Regular monitoring of the blood pressure. 7. Reduce sodium intake to less than 100 mmol/day (less than 2.3 gm of sodium or less than 6 gm of sodium choride)   This patient was seen by [place] with Dr Lavera Guise as a part of collaborative care agreement  Orders Placed This Encounter  Procedures  . UA/M w/rflx Culture, Routine    Meds ordered this encounter  Medications  . albuterol (VENTOLIN HFA) 108 (90 Base) MCG/ACT inhaler    Sig: Inhale 2 puffs into the lungs every 6 (six) hours as needed for wheezing or shortness of breath.    Dispense:  18 g    Refill:  5    Order Specific Question:   Supervising Provider    Answer:   Lavera Guise [[id number]]    Time spent: [place], MD  Internal Medicine

## 2019-02-17 LAB — MICROSCOPIC EXAMINATION: Casts: NONE SEEN /lpf

## 2019-02-17 LAB — UA/M W/RFLX CULTURE, ROUTINE
Bilirubin, UA: NEGATIVE
Glucose, UA: NEGATIVE
Ketones, UA: NEGATIVE
Leukocytes,UA: NEGATIVE
Nitrite, UA: NEGATIVE
Protein,UA: NEGATIVE
RBC, UA: NEGATIVE
Specific Gravity, UA: 1.014 (ref 1.005–1.030)
Urobilinogen, Ur: 0.2 mg/dL (ref 0.2–1.0)
pH, UA: 7 (ref 5.0–7.5)

## 2019-02-21 ENCOUNTER — Other Ambulatory Visit: Payer: 59

## 2019-02-21 ENCOUNTER — Other Ambulatory Visit: Payer: Self-pay

## 2019-02-21 DIAGNOSIS — C61 Malignant neoplasm of prostate: Secondary | ICD-10-CM

## 2019-02-22 LAB — PSA: Prostate Specific Ag, Serum: 7.1 ng/mL — ABNORMAL HIGH (ref 0.0–4.0)

## 2019-02-23 ENCOUNTER — Other Ambulatory Visit: Payer: 59

## 2019-02-28 ENCOUNTER — Other Ambulatory Visit: Payer: Self-pay | Admitting: Nurse Practitioner

## 2019-02-28 MED ORDER — METOPROLOL TARTRATE 25 MG PO TABS: 25.0000 mg | ORAL_TABLET | Freq: Two times a day (BID) | ORAL | 1 refills | Status: DC

## 2019-03-02 ENCOUNTER — Encounter: Payer: Self-pay | Admitting: Urology

## 2019-03-02 ENCOUNTER — Ambulatory Visit: Payer: 59 | Admitting: Urology

## 2019-05-29 ENCOUNTER — Other Ambulatory Visit: Payer: Self-pay

## 2019-05-29 MED ORDER — HYDROCHLOROTHIAZIDE 12.5 MG PO TABS: ORAL_TABLET | ORAL | 2 refills | Status: DC

## 2019-05-29 MED ORDER — SIMVASTATIN 40 MG PO TABS: ORAL_TABLET | ORAL | 2 refills | Status: DC

## 2019-06-28 ENCOUNTER — Encounter: Payer: Self-pay | Admitting: Urology

## 2019-06-28 ENCOUNTER — Ambulatory Visit: Payer: 59 | Admitting: Urology

## 2019-06-28 ENCOUNTER — Other Ambulatory Visit: Payer: Self-pay

## 2019-06-28 VITALS — BP 143/88 | HR 77 | Ht 70.0 in | Wt 216.0 lb

## 2019-06-28 DIAGNOSIS — C61 Malignant neoplasm of prostate: Secondary | ICD-10-CM | POA: Diagnosis not present

## 2019-06-28 NOTE — Progress Notes (Signed)
   06/28/2019 9:48 AM   James Bush December 07, 1960 [id number]  Reason for visit: Follow up very low risk prostate cancer  HPI: I saw James Bush in urology clinic today for follow-up of his very low risk prostate cancer.  Briefly he is a 58 year old African-American male diagnosed with new diagnosis of Parkinson's disease in the last 18 months who was diagnosed with very low risk prostate cancer in March 2017.  He does have a family history of non-lethal prostate cancer in his uncle.  PSA at time of diagnosis was 6.25, and biopsy showed 1/12 cores positive for Gleason score 3+3= 6 disease.  Confirmatory biopsy in October 2017 showed 2/12 cores of 3+3=6 disease, with max core involvement of 33%.  Prostate volume is 53 g, with PSA density of 0.13.  PSA June 2020 is stable at 7.1 from 6.7 six months ago, and 7.01 July 2017.  He denies any bone pain or weight loss.  He has very mild urinary symptoms with occasional nocturia 1-2 times at night.  He does admit to drinking fluids and tea in the evening and this is likely more behavioral related.  He denies any significant urinary symptoms during the day.    ROS: Please see flowsheet from today's date for complete review of systems.  Physical Exam: BP 140/86   Pulse 66   Ht 5\' 7"  (1.702 m)   Wt 213 lb (96.6 kg)   BMI 33.36 kg/m    Constitutional:  Alert and oriented, No acute distress. Respiratory: Normal respiratory effort, no increased work of breathing. GI: Abdomen is soft, nontender, nondistended, no abdominal masses GU: No CVA tenderness DRE: 50 g, smooth, slightly enlarged/asymmetric right lobe but no distinct nodules or masses Skin: No rashes, bruises or suspicious lesions. Neurologic: Grossly intact, no focal deficits, moving all 4 extremities. Psychiatric: Normal mood and affect  Laboratory Data: PSA history reviewed   Assessment & Plan:   In summary, James Bush is a 58 year old African-American male with Parkinson's  disease and with very low risk prostate cancer on active surveillance.  PSA is stable at 7.1.  He remains asymptomatic.    -Continue active surveillance, we discussed with his diagnosis of Parkinson's disease I would not recommend aggressive surveillance or intervention for his very low risk prostate cancer.  We discussed the very low, but not zero, risk of developing metastatic disease while on surveillance.  I recommended continuing PSA/DRE screening on a yearly basis.  If the PSA rose significantly in the future would consider prostate MRI or prostate biopsy. -RTC 1 year PSA/DRE  A total of 25 minutes were spent face-to-face with the patient, greater than 50% was spent in patient education, counseling, and coordination of care regarding very low risk prostate cancer in the setting of Parkinson's disease.  [place], [facility] [address], [place], [place] [address] [phone]

## 2019-08-15 ENCOUNTER — Telehealth: Payer: Self-pay

## 2019-08-15 NOTE — Telephone Encounter (Signed)
CONFIRMED 08-17-19 AS A VIRTUAL VISIT.

## 2019-08-17 ENCOUNTER — Other Ambulatory Visit: Payer: Self-pay

## 2019-08-17 ENCOUNTER — Ambulatory Visit: Payer: No Typology Code available for payment source | Admitting: Nurse Practitioner

## 2019-08-17 ENCOUNTER — Encounter: Payer: Self-pay | Admitting: Nurse Practitioner

## 2019-08-17 VITALS — BP 127/81 | Ht 70.0 in | Wt 216.0 lb

## 2019-08-17 DIAGNOSIS — R0602 Shortness of breath: Secondary | ICD-10-CM

## 2019-08-17 DIAGNOSIS — I10 Essential (primary) hypertension: Secondary | ICD-10-CM | POA: Diagnosis not present

## 2019-08-17 DIAGNOSIS — G20 Parkinson's disease: Secondary | ICD-10-CM

## 2019-08-17 MED ORDER — METOPROLOL TARTRATE 25 MG PO TABS: 25.0000 mg | ORAL_TABLET | Freq: Two times a day (BID) | ORAL | 1 refills | Status: DC

## 2019-08-17 NOTE — Progress Notes (Signed)
[facility] [place], [place] [address]  Internal MEDICINE  Telephone Visit  Patient Name: James Bush  [id number]  [id number]  Date of Service: 08/17/2019  I connected with the patient at 9:50am by webcam and verified the patients identity using two identifiers.   I discussed the limitations, risks, security and privacy concerns of performing an evaluation and management service by webcam and the availability of in person appointments. I also discussed with the patient that there may be a patient responsible charge related to the service.  The patient expressed understanding and agrees to proceed.    Chief Complaint  Patient presents with  . Telephone Screen  . Telephone Assessment  . Hypertension  . Hyperlipidemia  . Medication Refill    metoprolol    The patient has been contacted via webcam for follow up visit due to concerns for spread of novel coronavirus. The patient presents for routine follow up. Blood pressure is well managed. He continues to see neurologist for parkinson's disease. They have increased his Sinemet to three times daily. He states it makes him a little tired, but no other negative side effects are noted. He has no concerns or complaints today. States that he is doing pretty well.       Current Medication: Outpatient Encounter Medications as of 08/17/2019  Medication Sig  . albuterol (VENTOLIN HFA) 108 (90 Base) MCG/ACT inhaler Inhale 2 puffs into the lungs every 6 (six) hours as needed for wheezing or shortness of breath.  Marland Kitchen aspirin 81 MG tablet Take 81 mg by mouth daily.  Marland Kitchen atorvastatin (LIPITOR) 40 MG tablet Take by mouth.  . Carbidopa-Levodopa ER (SINEMET CR) 25-100 MG tablet controlled release Take 1 tablet by mouth 2 (two) times daily.  . hydrochlorothiazide (HYDRODIURIL) 12.5 MG tablet TAKE 1 TABLET(S) BY MOUTH DAILY FOR BLOOD PRESSURE  . metoprolol tartrate (LOPRESSOR) 25 MG tablet Take 1 tablet (25 mg total) by mouth 2  (two) times daily.  . simvastatin (ZOCOR) 40 MG tablet TAKE 1 TABLET BY MOUTH EVERY DAY AT NIGHT  . [DISCONTINUED] metoprolol tartrate (LOPRESSOR) 25 MG tablet Take 1 tablet (25 mg total) by mouth 2 (two) times daily.   No facility-administered encounter medications on file as of 08/17/2019.    Surgical History: Past Surgical History:  Procedure Laterality Date  . ablasion    . KNEE ARTHROPLASTY Right     Medical History: Past Medical History:  Diagnosis Date  . Asthma   . Heart murmur   . Hyperlipidemia   . Hypertension     Family History: Family History  Problem Relation Age of Onset  . Hematuria Neg Hx   . Kidney cancer Neg Hx   . Prostate cancer Neg Hx     Social History   Socioeconomic History  . Marital status: Married    Spouse name: Not on file  . Number of children: Not on file  . Years of education: Not on file  . Highest education level: Not on file  Occupational History  . Not on file  Tobacco Use  . Smoking status: Never Smoker  . Smokeless tobacco: Never Used  Substance and Sexual Activity  . Alcohol use: No    Alcohol/week: 0.0 standard drinks  . Drug use: No  . Sexual activity: Not on file  Other Topics Concern  . Not on file  Social History Narrative  . Not on file   Social Determinants of Health   Financial Resource Strain:   .  Difficulty of Paying Living Expenses: Not on file  Food Insecurity:   . Worried About Charity fundraiser in the Last Year: Not on file  . Ran Out of Food in the Last Year: Not on file  Transportation Needs:   . Lack of Transportation (Medical): Not on file  . Lack of Transportation (Non-Medical): Not on file  Physical Activity:   . Days of Exercise per Week: Not on file  . Minutes of Exercise per Session: Not on file  Stress:   . Feeling of Stress : Not on file  Social Connections:   . Frequency of Communication with Friends and Family: Not on file  . Frequency of Social Gatherings with Friends and  Family: Not on file  . Attends Religious Services: Not on file  . Active Member of Clubs or Organizations: Not on file  . Attends Archivist Meetings: Not on file  . Marital Status: Not on file  Intimate Partner Violence:   . Fear of Current or Ex-Partner: Not on file  . Emotionally Abused: Not on file  . Physically Abused: Not on file  . Sexually Abused: Not on file      Review of Systems  Constitutional: Positive for fatigue. Negative for activity change, chills and unexpected weight change.  HENT: Negative for congestion, postnasal drip, rhinorrhea, sneezing and sore throat.   Respiratory: Positive for shortness of breath. Negative for cough and chest tightness.        States that he seldom has to use rescue inhaler.   Cardiovascular: Negative for chest pain and palpitations.  Gastrointestinal: Negative for abdominal pain, constipation, diarrhea, nausea and vomiting.  Endocrine: Negative for cold intolerance, heat intolerance, polydipsia and polyuria.  Genitourinary: Negative for dysuria.  Musculoskeletal: Negative for arthralgias, back pain, joint swelling and neck pain.  Skin: Negative for rash.  Allergic/Immunologic: Negative for environmental allergies.  Neurological: Positive for tremors and weakness. Negative for numbness.       Right sided upper and lower extremity weakness. Tremor present in right hand with exertion.  The patient is now seeing neurology and diagnosed with parkinson's disease.   Hematological: Negative for adenopathy. Does not bruise/bleed easily.  Psychiatric/Behavioral: Negative for behavioral problems (Depression), dysphoric mood, sleep disturbance and suicidal ideas. The patient is not nervous/anxious.     Today's Vitals   08/17/19 0933  BP: 127/81  Weight: 216 lb (98 kg)  Height: 5\' 10"  (1.778 m)   Body mass index is 30.99 kg/m.  Observation/Objective:  The patient is alert and oriented. He is pleasant and answering all questions  appropriately. Breathing is non-labored. He is in no acute distress.    Assessment/Plan: 1. Essential hypertension Stable. Continue bp medication as prescribed  - metoprolol tartrate (LOPRESSOR) 25 MG tablet; Take 1 tablet (25 mg total) by mouth 2 (two) times daily.  Dispense: 180 tablet; Refill: 1  2. Shortness of breath Should continue to use rescue inhaler as needed and as prescribed   3. Parkinson's disease ([place]) Continue regular visits with neurologist as scheduled.   General Counseling: uzziel riedinger understanding of the findings of today's phone visit and agrees with plan of treatment. I have discussed any further diagnostic evaluation that may be needed or ordered today. We also reviewed his medications today. he has been encouraged to call the office with any questions or concerns that should arise related to todays visit.   Hypertension Counseling:   The following hypertensive lifestyle modification were recommended and discussed:  1. Limiting  alcohol intake to less than 1 oz/day of ethanol:(24 oz of beer or 8 oz of wine or 2 oz of 100-proof whiskey). 2. Take baby ASA 81 mg daily. 3. Importance of regular aerobic exercise and losing weight. 4. Reduce dietary saturated fat and cholesterol intake for overall cardiovascular health. 5. Maintaining adequate dietary potassium, calcium, and magnesium intake. 6. Regular monitoring of the blood pressure. 7. Reduce sodium intake to less than 100 mmol/day (less than 2.3 gm of sodium or less than 6 gm of sodium choride)   This patient was seen by [place] with Dr Lavera Guise as a part of collaborative care agreement  Meds ordered this encounter  Medications  . metoprolol tartrate (LOPRESSOR) 25 MG tablet    Sig: Take 1 tablet (25 mg total) by mouth 2 (two) times daily.    Dispense:  180 tablet    Refill:  1    Order Specific Question:   Supervising Provider    Answer:   Lavera Guise [id number]    Time  spent: 62 Minutes    Dr Lavera Guise Internal medicine

## 2019-11-04 ENCOUNTER — Ambulatory Visit: Payer: 59 | Attending: Internal Medicine

## 2019-11-04 DIAGNOSIS — Z23 Encounter for immunization: Secondary | ICD-10-CM | POA: Insufficient documentation

## 2019-11-04 NOTE — Progress Notes (Signed)
   Covid-19 Vaccination Clinic  Name:  Arron Zapata    MRN: [id number] DOB: [birth year]  11/04/2019  Mr. Edgerson was observed post Covid-19 immunization for 15 minutes without incident. He was provided with Vaccine Information Sheet and instruction to access the V-Safe system.   Mr. Rudesill was instructed to call 911 with any severe reactions post vaccine: Marland Kitchen Difficulty breathing  . Swelling of face and throat  . A fast heartbeat  . A bad rash all over body  . Dizziness and weakness   Immunizations Administered    Name Date Dose VIS Date Route   Moderna COVID-19 Vaccine 11/04/2019  4:45 PM 0.5 mL 08/01/2019 Intramuscular   Manufacturer: Moderna   Lot: [id number]   [place][id number]

## 2019-11-05 ENCOUNTER — Ambulatory Visit: Payer: Self-pay

## 2019-11-09 ENCOUNTER — Ambulatory Visit: Payer: 59 | Attending: Internal Medicine

## 2019-12-02 ENCOUNTER — Ambulatory Visit: Payer: 59 | Attending: Internal Medicine

## 2019-12-02 ENCOUNTER — Ambulatory Visit: Payer: 59

## 2019-12-02 DIAGNOSIS — Z23 Encounter for immunization: Secondary | ICD-10-CM

## 2019-12-02 NOTE — Progress Notes (Signed)
   Covid-19 Vaccination Clinic  Name:  JOHUA ESPEJEL    MRN: [id number] DOB: 01/03/1961  12/02/2019  Mr. Burke was observed post Covid-19 immunization for 15 minutes without incident. He was provided with Vaccine Information Sheet and instruction to access the V-Safe system.   Mr. Griscom was instructed to call 911 with any severe reactions post vaccine: Marland Kitchen Difficulty breathing  . Swelling of face and throat  . A fast heartbeat  . A bad rash all over body  . Dizziness and weakness   Immunizations Administered    Name Date Dose VIS Date Route   Moderna COVID-19 Vaccine 12/02/2019  1:36 PM 0.5 mL 08/01/2019 Intramuscular   Manufacturer: Levan Hurst   [id number]   [id number]

## 2019-12-04 ENCOUNTER — Other Ambulatory Visit: Payer: Self-pay

## 2019-12-04 DIAGNOSIS — I10 Essential (primary) hypertension: Secondary | ICD-10-CM

## 2019-12-04 MED ORDER — HYDROCHLOROTHIAZIDE 12.5 MG PO TABS: ORAL_TABLET | ORAL | 1 refills | Status: DC

## 2019-12-04 MED ORDER — METOPROLOL TARTRATE 25 MG PO TABS: 25.0000 mg | ORAL_TABLET | Freq: Two times a day (BID) | ORAL | 1 refills | Status: DC

## 2020-02-15 ENCOUNTER — Other Ambulatory Visit: Payer: Self-pay

## 2020-02-15 MED ORDER — SIMVASTATIN 40 MG PO TABS: ORAL_TABLET | ORAL | 0 refills | Status: DC

## 2020-02-21 ENCOUNTER — Other Ambulatory Visit: Payer: Self-pay

## 2020-02-21 DIAGNOSIS — R0602 Shortness of breath: Secondary | ICD-10-CM

## 2020-02-21 MED ORDER — ALBUTEROL SULFATE HFA 108 (90 BASE) MCG/ACT IN AERS: 2.0000 | INHALATION_SPRAY | Freq: Four times a day (QID) | RESPIRATORY_TRACT | 5 refills | Status: DC | PRN

## 2020-02-21 MED ORDER — SIMVASTATIN 40 MG PO TABS: ORAL_TABLET | ORAL | 0 refills | Status: DC

## 2020-02-26 ENCOUNTER — Ambulatory Visit: Payer: 59 | Admitting: Nurse Practitioner

## 2020-03-19 ENCOUNTER — Telehealth: Payer: Self-pay

## 2020-03-19 NOTE — Telephone Encounter (Signed)
CONFIRMED PT APT.OFFICE-AR

## 2020-03-21 ENCOUNTER — Ambulatory Visit (INDEPENDENT_AMBULATORY_CARE_PROVIDER_SITE_OTHER): Payer: No Typology Code available for payment source | Admitting: Nurse Practitioner

## 2020-03-21 ENCOUNTER — Encounter: Payer: Self-pay | Admitting: Nurse Practitioner

## 2020-03-21 ENCOUNTER — Other Ambulatory Visit: Payer: Self-pay

## 2020-03-21 VITALS — BP 157/99 | HR 72 | Temp 94.4°F | Resp 16 | Ht 70.0 in | Wt 210.8 lb

## 2020-03-21 DIAGNOSIS — I10 Essential (primary) hypertension: Secondary | ICD-10-CM | POA: Diagnosis not present

## 2020-03-21 DIAGNOSIS — Z0001 Encounter for general adult medical examination with abnormal findings: Secondary | ICD-10-CM | POA: Diagnosis not present

## 2020-03-21 DIAGNOSIS — R0600 Dyspnea, unspecified: Secondary | ICD-10-CM | POA: Diagnosis not present

## 2020-03-21 DIAGNOSIS — R0609 Other forms of dyspnea: Secondary | ICD-10-CM

## 2020-03-21 DIAGNOSIS — E782 Mixed hyperlipidemia: Secondary | ICD-10-CM

## 2020-03-21 DIAGNOSIS — G20 Parkinson's disease: Secondary | ICD-10-CM

## 2020-03-21 DIAGNOSIS — R300 Dysuria: Secondary | ICD-10-CM

## 2020-03-21 MED ORDER — SIMVASTATIN 40 MG PO TABS: ORAL_TABLET | ORAL | 3 refills | Status: DC

## 2020-03-21 MED ORDER — HYDROCHLOROTHIAZIDE 25 MG PO TABS: ORAL_TABLET | ORAL | 1 refills | Status: DC

## 2020-03-21 NOTE — Progress Notes (Signed)
[facility] [place], [place] [address]  Internal MEDICINE  Office Visit Note  Patient Name: James Bush  [id number]  [id number]  Date of Service: 04/17/2020   Pt is here for routine health maintenance examination  Chief Complaint  Patient presents with  . Medicare Wellness  . Hyperlipidemia  . Hypertension  . Quality Metric Gaps    TDAP     The patient presents to the office for health maintenance exam. Today, the patient is complaining of shortness of breath which gets worse with exertion. Blood pressure is mildly elevated. He is due to have routine, fasting labs. He sees neurologist. Currently takes Sinemet to help with symptoms associated with Parkinson's disease. He has had both of his Moderna COVID 19 vaccines and did well with them.     Current Medication: Outpatient Encounter Medications as of 03/21/2020  Medication Sig  . albuterol (VENTOLIN HFA) 108 (90 Base) MCG/ACT inhaler Inhale 2 puffs into the lungs every 6 (six) hours as needed for wheezing or shortness of breath.  Marland Kitchen aspirin 81 MG tablet Take 81 mg by mouth daily.  . Carbidopa-Levodopa ER (SINEMET CR) 25-100 MG tablet controlled release Take 1 tablet by mouth 2 (two) times daily.  . hydrochlorothiazide (HYDRODIURIL) 25 MG tablet TAKE 1 TABLET(S) BY MOUTH DAILY FOR BLOOD PRESSURE  . metoprolol tartrate (LOPRESSOR) 25 MG tablet Take 1 tablet (25 mg total) by mouth 2 (two) times daily.  . simvastatin (ZOCOR) 40 MG tablet TAKE 1 TABLET BY MOUTH EVERY DAY AT NIGHT  . [DISCONTINUED] atorvastatin (LIPITOR) 40 MG tablet Take by mouth.  . [DISCONTINUED] hydrochlorothiazide (HYDRODIURIL) 12.5 MG tablet TAKE 1 TABLET(S) BY MOUTH DAILY FOR BLOOD PRESSURE  . [DISCONTINUED] simvastatin (ZOCOR) 40 MG tablet TAKE 1 TABLET BY MOUTH EVERY DAY AT NIGHT   No facility-administered encounter medications on file as of 03/21/2020.    Surgical History: Past Surgical History:  Procedure Laterality Date  .  ablasion    . KNEE ARTHROPLASTY Right     Medical History: Past Medical History:  Diagnosis Date  . Asthma   . Heart murmur   . Hyperlipidemia   . Hypertension     Family History: Family History  Problem Relation Age of Onset  . Hematuria Neg Hx   . Kidney cancer Neg Hx   . Prostate cancer Neg Hx       Review of Systems  Constitutional: Positive for fatigue. Negative for activity change, chills and unexpected weight change.  HENT: Negative for congestion, postnasal drip, rhinorrhea, sneezing and sore throat.   Respiratory: Positive for shortness of breath. Negative for cough and chest tightness.        Increased shortness of breath, especially with exertion.   Cardiovascular: Negative for chest pain and palpitations.       Blood pressure elevated today.   Gastrointestinal: Negative for abdominal pain, constipation, diarrhea, nausea and vomiting.  Endocrine: Negative for cold intolerance, heat intolerance, polydipsia and polyuria.  Genitourinary: Negative for dysuria, frequency and urgency.  Musculoskeletal: Negative for arthralgias, back pain, joint swelling and neck pain.  Skin: Negative for rash.  Allergic/Immunologic: Negative for environmental allergies.  Neurological: Positive for tremors and weakness. Negative for numbness.       Right sided upper and lower extremity weakness. Tremor present in right hand with exertion.  The patient is now seeing neurology and diagnosed with parkinson's disease.   Hematological: Negative for adenopathy. Does not bruise/bleed easily.  Psychiatric/Behavioral: Negative for behavioral problems (Depression),  dysphoric mood, sleep disturbance and suicidal ideas. The patient is not nervous/anxious.      Today's Vitals   03/21/20 1449  BP: (!) 157/99  Pulse: 72  Resp: 16  Temp: (!) 94.4 F (34.7 C)  SpO2: 96%  Weight: (!) 210 lb 12.8 oz (95.6 kg)  Height: 5\' 10"  (1.778 m)   Body mass index is 30.25 kg/m.  Physical Exam Vitals  and nursing note reviewed.  Constitutional:      General: He is not in acute distress.    Appearance: Normal appearance. He is well-developed. He is not diaphoretic.  HENT:     Head: Normocephalic and atraumatic.     Mouth/Throat:     Pharynx: No oropharyngeal exudate.  Eyes:     Conjunctiva/sclera: Conjunctivae normal.     Pupils: Pupils are equal, round, and reactive to light.  Neck:     Thyroid: No thyromegaly.     Vascular: No carotid bruit or JVD.     Trachea: No tracheal deviation.  Cardiovascular:     Rate and Rhythm: Normal rate and regular rhythm.     Pulses: Normal pulses.     Heart sounds: Normal heart sounds. No murmur heard.  No friction rub. No gallop.   Pulmonary:     Effort: Pulmonary effort is normal. No respiratory distress.     Breath sounds: Normal breath sounds. No wheezing or rales.  Chest:     Chest wall: No tenderness.  Abdominal:     General: Bowel sounds are normal.     Palpations: Abdomen is soft.     Tenderness: There is no abdominal tenderness.  Musculoskeletal:        General: Normal range of motion.     Cervical back: Normal range of motion and neck supple.  Lymphadenopathy:     Cervical: No cervical adenopathy.  Skin:    General: Skin is warm and dry.     Capillary Refill: Capillary refill takes less than 2 seconds.  Neurological:     Mental Status: He is alert and oriented to person, place, and time. Mental status is at baseline.     Cranial Nerves: No cranial nerve deficit.     Comments: Mild/moderate tremor noted of the right arm. He carries the arm close to the body   Psychiatric:        Mood and Affect: Mood normal.        Behavior: Behavior normal.        Thought Content: Thought content normal.        Judgment: Judgment normal.    Depression screen [facility] 2/9 03/21/2020 08/17/2019 02/16/2019 09/15/2018 08/18/2018  Decreased Interest 0 0 0 0 0  Down, Depressed, Hopeless 0 0 0 0 0  PHQ - 2 Score 0 0 0 0 0    Functional Status  Survey: Is the patient deaf or have difficulty hearing?: No Does the patient have difficulty seeing, even when wearing glasses/contacts?: No Does the patient have difficulty concentrating, remembering, or making decisions?: No Does the patient have difficulty walking or climbing stairs?: No Does the patient have difficulty dressing or bathing?: No Does the patient have difficulty doing errands alone such as visiting a doctor's office or shopping?: No  MMSE - Mini Mental State Exam 03/21/2020 02/16/2019  Orientation to time 5 5  Orientation to Place 5 5  Registration 3 3  Attention/ Calculation 5 5  Recall 3 3  Language- name 2 objects 2 2  Language- repeat 1 1  Language- follow 3 step command 3 3  Language- read & follow direction 1 1  Write a sentence 1 0  Copy design 1 1  Total score 30 29    Fall Risk  03/21/2020 08/17/2019 02/16/2019 09/15/2018 08/18/2018  Falls in the past year? 0 0 0 0 0      LABS: Recent Results (from the past 2160 hour(s))  UA/M w/rflx Culture, Routine     Status: Abnormal   Collection Time: 03/21/20  2:25 PM   Specimen: Urine   Urine  Result Value Ref Range   Specific Gravity, UA 1.028 1.005 - 1.030   pH, UA 7.5 5.0 - 7.5   Color, UA Yellow Yellow   Appearance Ur Cloudy (A) Clear   Leukocytes,UA Negative Negative   Protein,UA Negative Negative/Trace   Glucose, UA Negative Negative   Ketones, UA Negative Negative   RBC, UA Negative Negative   Bilirubin, UA Negative Negative   Urobilinogen, Ur 0.2 0.2 - 1.0 mg/dL   Nitrite, UA Negative Negative   Microscopic Examination Comment     Comment: Microscopic follows if indicated.   Microscopic Examination See below:     Comment: Microscopic was indicated and was performed.   Urinalysis Reflex Comment     Comment: This specimen will not reflex to a Urine Culture.  Microscopic Examination     Status: None   Collection Time: 03/21/20  2:25 PM   Urine  Result Value Ref Range   WBC, UA None seen 0 - 5  /hpf   RBC None seen 0 - 2 /hpf   Epithelial Cells (non renal) None seen 0 - 10 /hpf   Casts None seen None seen /lpf   Bacteria, UA None seen None seen/Few  CBC     Status: None   Collection Time: 03/28/20  9:22 AM  Result Value Ref Range   WBC 6.3 4.0 - 10.5 K/uL   RBC 4.45 4.22 - 5.81 MIL/uL   Hemoglobin 13.7 13.0 - 17.0 g/dL   HCT 41.9 39 - 52 %   MCV 94.2 80.0 - 100.0 fL   MCH 30.8 26.0 - 34.0 pg   MCHC 32.7 30.0 - 36.0 g/dL   RDW 11.9 11.5 - 15.5 %   Platelets 219 150 - 400 K/uL   nRBC 0.0 0.0 - 0.2 %    Comment: Performed at [facility], [address] [place], [place] [address]  Comprehensive metabolic panel     Status: Abnormal   Collection Time: 03/28/20  9:22 AM  Result Value Ref Range   Sodium 140 135 - 145 mmol/L   Potassium 4.3 3.5 - 5.1 mmol/L   Chloride 101 98 - 111 mmol/L   CO2 30 22 - 32 mmol/L   Glucose, Bld 110 (H) 70 - 99 mg/dL    Comment: Glucose reference range applies only to samples taken after fasting for at least 8 hours.   BUN 20 8 - 23 mg/dL   Creatinine, Ser 1.08 0.61 - 1.24 mg/dL   Calcium 9.1 8.9 - 10.3 mg/dL   Total Protein 7.5 6.5 - 8.1 g/dL   Albumin 4.2 3.5 - 5.0 g/dL   AST 16 15 - 41 U/L   ALT <5 0 - 44 U/L   Alkaline Phosphatase 44 38 - 126 U/L   Total Bilirubin 1.0 0.3 - 1.2 mg/dL   GFR calc non Af Amer >60 >60 mL/min   GFR calc Af Amer >60 >60 mL/min   Anion gap 9  5 - 15    Comment: Performed at [facility], [address] [place], [place] [address]  Lipid panel     Status: Abnormal   Collection Time: 03/28/20  9:22 AM  Result Value Ref Range   Cholesterol 173 0 - 200 mg/dL   Triglycerides 74 <150 mg/dL   HDL 51 >40 mg/dL   Total CHOL/HDL Ratio 3.4 RATIO   VLDL 15 0 - 40 mg/dL   LDL Cholesterol 107 (H) 0 - 99 mg/dL    Comment:        Total Cholesterol/HDL:CHD Risk Coronary Heart Disease Risk Table                     Men   Women  1/2 Average Risk   3.4   3.3  Average Risk       5.0   4.4  2 X  Average Risk   9.6   7.1  3 X Average Risk  23.4   11.0        Use the calculated Patient Ratio above and the CHD Risk Table to determine the patient's CHD Risk.        ATP III CLASSIFICATION (LDL):  <100     mg/dL   Optimal  100-129  mg/dL   Near or Above                    Optimal  130-159  mg/dL   Borderline  160-189  mg/dL   High  >190     mg/dL   Very High Performed at [facility], [address] [place], [place] [address]   TSH     Status: None   Collection Time: 03/28/20  9:22 AM  Result Value Ref Range   TSH 1.041 0.350 - 4.500 uIU/mL    Comment: Performed by a 3rd Generation assay with a functional sensitivity of <=0.01 uIU/mL. Performed at [facility], [address] [place], [place] [address]   T4, free     Status: None   Collection Time: 03/28/20  9:22 AM  Result Value Ref Range   Free T4 0.86 0.61 - 1.12 ng/dL    Comment: (NOTE) Biotin ingestion may interfere with free T4 tests. If the results are inconsistent with the TSH level, previous test results, or the clinical presentation, then consider biotin interference. If needed, order repeat testing after stopping biotin. Performed at [facility], [address] [place], [place] [address]   Hepatitis C antibody     Status: None   Collection Time: 03/28/20  9:22 AM  Result Value Ref Range   HCV Ab NON REACTIVE NON REACTIVE    Comment: (NOTE) Nonreactive HCV antibody screen is consistent with no HCV infections,  unless recent infection is suspected or other evidence exists to indicate HCV infection.  Performed at [facility], [place], [place], [place] [address]      Assessment/Plan: 1. Encounter for general adult medical examination with abnormal findings Annual health maintenance exam today. Order slip given to have routine, fasting labs drawn.  2. Essential hypertension Increase HCTZ to 25mg  daily. Continue metoprolol as previously prescribed -  hydrochlorothiazide (HYDRODIURIL) 25 MG tablet; TAKE 1 TABLET(S) BY MOUTH DAILY FOR BLOOD PRESSURE  Dispense: 90 tablet; Refill: 1  3. Dyspnea on exertion Will get echocardiogram for further evaluation.  - ECHOCARDIOGRAM COMPLETE; Future  4. Mixed hyperlipidemia Check fasting lipid panel and adjust medication as indicated.  -  simvastatin (ZOCOR) 40 MG tablet; TAKE 1 TABLET BY MOUTH EVERY DAY AT NIGHT  Dispense: 90 tablet; Refill: 3  5. Parkinson's disease ([place]) Continue sinemet as prescribed. Regular visits with neurology as scheduled.   6. Dysuria - UA/M w/rflx Culture, Routine  General Counseling: Lajuane verbalizes understanding of the findings of todays visit and agrees with plan of treatment. I have discussed any further diagnostic evaluation that may be needed or ordered today. We also reviewed his medications today. he has been encouraged to call the office with any questions or concerns that should arise related to todays visit.    Counseling:  Hypertension Counseling:   The following hypertensive lifestyle modification were recommended and discussed:  1. Limiting alcohol intake to less than 1 oz/day of ethanol:(24 oz of beer or 8 oz of wine or 2 oz of 100-proof whiskey). 2. Take baby ASA 81 mg daily. 3. Importance of regular aerobic exercise and losing weight. 4. Reduce dietary saturated fat and cholesterol intake for overall cardiovascular health. 5. Maintaining adequate dietary potassium, calcium, and magnesium intake. 6. Regular monitoring of the blood pressure. 7. Reduce sodium intake to less than 100 mmol/day (less than 2.3 gm of sodium or less than 6 gm of sodium choride)   This patient was seen by [place] with Dr Lavera Guise as a part of collaborative care agreement   Orders Placed This Encounter  Procedures  . Microscopic Examination  . UA/M w/rflx Culture, Routine  . ECHOCARDIOGRAM COMPLETE    Meds ordered this encounter   Medications  . simvastatin (ZOCOR) 40 MG tablet    Sig: TAKE 1 TABLET BY MOUTH EVERY DAY AT NIGHT    Dispense:  90 tablet    Refill:  3    Order Specific Question:   Supervising Provider    Answer:   Lavera Guise [[id number]]  . hydrochlorothiazide (HYDRODIURIL) 25 MG tablet    Sig: TAKE 1 TABLET(S) BY MOUTH DAILY FOR BLOOD PRESSURE    Dispense:  90 tablet    Refill:  1    Increased dose    Order Specific Question:   Supervising Provider    Answer:   Lavera Guise [[id number]]    Total time spent: 16 Minutes  Time spent includes review of chart, medications, test results, and follow up plan with the patient.     Lavera Guise, MD  Internal Medicine

## 2020-03-22 LAB — UA/M W/RFLX CULTURE, ROUTINE
Bilirubin, UA: NEGATIVE
Glucose, UA: NEGATIVE
Ketones, UA: NEGATIVE
Leukocytes,UA: NEGATIVE
Nitrite, UA: NEGATIVE
Protein,UA: NEGATIVE
RBC, UA: NEGATIVE
Specific Gravity, UA: 1.028 (ref 1.005–1.030)
Urobilinogen, Ur: 0.2 mg/dL (ref 0.2–1.0)
pH, UA: 7.5 (ref 5.0–7.5)

## 2020-03-22 LAB — MICROSCOPIC EXAMINATION
Bacteria, UA: NONE SEEN
Casts: NONE SEEN /lpf
Epithelial Cells (non renal): NONE SEEN /hpf (ref 0–10)
RBC, Urine: NONE SEEN /hpf (ref 0–2)
WBC, UA: NONE SEEN /hpf (ref 0–5)

## 2020-03-28 ENCOUNTER — Other Ambulatory Visit
Admission: RE | Admit: 2020-03-28 | Discharge: 2020-03-28 | Disposition: A | Discharge status: Home / Self Care | Payer: No Typology Code available for payment source | Attending: Nurse Practitioner | Admitting: Nurse Practitioner

## 2020-03-28 DIAGNOSIS — I10 Essential (primary) hypertension: Secondary | ICD-10-CM | POA: Diagnosis not present

## 2020-03-28 DIAGNOSIS — E785 Hyperlipidemia, unspecified: Secondary | ICD-10-CM | POA: Insufficient documentation

## 2020-03-28 DIAGNOSIS — Z1159 Encounter for screening for other viral diseases: Secondary | ICD-10-CM | POA: Diagnosis not present

## 2020-03-28 DIAGNOSIS — Z0001 Encounter for general adult medical examination with abnormal findings: Secondary | ICD-10-CM | POA: Diagnosis present

## 2020-03-28 LAB — COMPREHENSIVE METABOLIC PANEL
ALT: <5 U/L (ref 0–44)
AST: 16 U/L (ref 15–41)
Albumin: 4.2 g/dL (ref 3.5–5.0)
Alkaline Phosphatase: 44 U/L (ref 38–126)
Anion gap: 9 (ref 5–15)
BUN: 20 mg/dL (ref 8–23)
CO2: 30 mmol/L (ref 22–32)
Calcium: 9.1 mg/dL (ref 8.9–10.3)
Chloride: 101 mmol/L (ref 98–111)
Creatinine, Ser: 1.08 mg/dL (ref 0.61–1.24)
GFR calc Af Amer: >60 mL/min (ref >60)
GFR calc non Af Amer: >60 mL/min (ref >60)
Glucose, Bld: 110 mg/dL — ABNORMAL HIGH (ref 70–99)
Potassium: 4.3 mmol/L (ref 3.5–5.1)
Sodium: 140 mmol/L (ref 135–145)
Total Bilirubin: 1 mg/dL (ref 0.3–1.2)
Total Protein: 7.5 g/dL (ref 6.5–8.1)

## 2020-03-28 LAB — CBC
HCT: 41.9 % (ref 39.0–52.0)
Hemoglobin: 13.7 g/dL (ref 13.0–17.0)
MCH: 30.8 pg (ref 26.0–34.0)
MCHC: 32.7 g/dL (ref 30.0–36.0)
MCV: 94.2 fL (ref 80.0–100.0)
Platelets: 219 10*3/uL (ref 150–400)
RBC: 4.45 MIL/uL (ref 4.22–5.81)
RDW: 11.9 % (ref 11.5–15.5)
WBC: 6.3 10*3/uL (ref 4.0–10.5)
nRBC: 0 % (ref 0.0–0.2)

## 2020-03-28 LAB — HEPATITIS C ANTIBODY: HCV Ab: NONREACTIVE

## 2020-03-28 LAB — LIPID PANEL
Cholesterol: 173 mg/dL (ref 0–200)
HDL: 51 mg/dL (ref >40)
LDL Cholesterol: 107 mg/dL — ABNORMAL HIGH (ref 0–99)
Total CHOL/HDL Ratio: 3.4 RATIO
Triglycerides: 74 mg/dL (ref <150)
VLDL: 15 mg/dL (ref 0–40)

## 2020-03-28 LAB — TSH: TSH: 1.041 u[IU]/mL (ref 0.350–4.500)

## 2020-03-28 LAB — T4, FREE: Free T4: 0.86 ng/dL (ref 0.61–1.12)

## 2020-03-28 NOTE — Progress Notes (Signed)
Waiting on all lab results. CBC good.

## 2020-04-19 ENCOUNTER — Other Ambulatory Visit: Payer: No Typology Code available for payment source

## 2020-04-26 ENCOUNTER — Other Ambulatory Visit: Payer: Self-pay

## 2020-04-26 ENCOUNTER — Ambulatory Visit: Payer: No Typology Code available for payment source

## 2020-04-26 DIAGNOSIS — R0609 Other forms of dyspnea: Secondary | ICD-10-CM

## 2020-04-26 DIAGNOSIS — R0602 Shortness of breath: Secondary | ICD-10-CM

## 2020-05-01 ENCOUNTER — Telehealth: Payer: Self-pay

## 2020-05-01 NOTE — Telephone Encounter (Signed)
Confirmed and screened for 05-03-20 ov.

## 2020-05-03 ENCOUNTER — Encounter: Payer: Self-pay | Admitting: Nurse Practitioner

## 2020-05-03 ENCOUNTER — Other Ambulatory Visit: Payer: Self-pay

## 2020-05-03 ENCOUNTER — Ambulatory Visit: Payer: No Typology Code available for payment source | Admitting: Nurse Practitioner

## 2020-05-03 VITALS — BP 132/92 | HR 89 | Temp 98.2°F | Resp 16 | Ht 70.0 in | Wt 211.4 lb

## 2020-05-03 DIAGNOSIS — R0600 Dyspnea, unspecified: Secondary | ICD-10-CM | POA: Diagnosis not present

## 2020-05-03 DIAGNOSIS — I2583 Coronary atherosclerosis due to lipid rich plaque: Secondary | ICD-10-CM

## 2020-05-03 DIAGNOSIS — R0609 Other forms of dyspnea: Secondary | ICD-10-CM

## 2020-05-03 DIAGNOSIS — I5189 Other ill-defined heart diseases: Secondary | ICD-10-CM | POA: Diagnosis not present

## 2020-05-03 DIAGNOSIS — I2510 Atherosclerotic heart disease of native coronary artery without angina pectoris: Secondary | ICD-10-CM | POA: Diagnosis not present

## 2020-05-03 DIAGNOSIS — I10 Essential (primary) hypertension: Secondary | ICD-10-CM

## 2020-05-03 MED ORDER — LOSARTAN POTASSIUM 25 MG PO TABS: 25.0000 mg | ORAL_TABLET | Freq: Every day | ORAL | 2 refills | Status: DC

## 2020-05-03 NOTE — Progress Notes (Signed)
[facility] [place], [place] [address]  Internal MEDICINE  Office Visit Note  Patient Name: James Bush  [id number]  [id number]  Date of Service: 05/06/2020  Chief Complaint  Patient presents with  . Follow-up    review blood work and echo results   . Hyperlipidemia  . Hypertension  . Quality Metric Gaps    TDAP, HIV screening    The patient is here for follow up visit. The patient had echocardiogram since his last visit as he was having shortness of breath, worse with exertion. The echocardiogram shows borderline hypokinesis of the heart and mild mitral valve regurgitation. His labs show mild elevation of LDL at 107 and glucose of 113. All other labs were normal. Today, he states that he is doing well. His blood pressure is improved and he states that his shortness of breath has also gotten better. He denies chest pain or chest pressure. He is treated per neurology for parkinson's disease.       Current Medication: Outpatient Encounter Medications as of 05/03/2020  Medication Sig  . albuterol (VENTOLIN HFA) 108 (90 Base) MCG/ACT inhaler Inhale 2 puffs into the lungs every 6 (six) hours as needed for wheezing or shortness of breath.  Marland Kitchen aspirin 81 MG tablet Take 81 mg by mouth daily.  . Carbidopa-Levodopa ER (SINEMET CR) 25-100 MG tablet controlled release Take 1 tablet by mouth 2 (two) times daily.  . hydrochlorothiazide (HYDRODIURIL) 25 MG tablet TAKE 1 TABLET(S) BY MOUTH DAILY FOR BLOOD PRESSURE  . metoprolol tartrate (LOPRESSOR) 25 MG tablet Take 1 tablet (25 mg total) by mouth 2 (two) times daily.  . simvastatin (ZOCOR) 40 MG tablet TAKE 1 TABLET BY MOUTH EVERY DAY AT NIGHT  . losartan (COZAAR) 25 MG tablet Take 1 tablet (25 mg total) by mouth daily.   No facility-administered encounter medications on file as of 05/03/2020.    Surgical History: Past Surgical History:  Procedure Laterality Date  . ablasion    . KNEE ARTHROPLASTY Right     Medical  History: Past Medical History:  Diagnosis Date  . Asthma   . Heart murmur   . Hyperlipidemia   . Hypertension     Family History: Family History  Problem Relation Age of Onset  . Hematuria Neg Hx   . Kidney cancer Neg Hx   . Prostate cancer Neg Hx     Social History   Socioeconomic History  . Marital status: Married    Spouse name: Not on file  . Number of children: Not on file  . Years of education: Not on file  . Highest education level: Not on file  Occupational History  . Not on file  Tobacco Use  . Smoking status: Never Smoker  . Smokeless tobacco: Never Used  Substance and Sexual Activity  . Alcohol use: No    Alcohol/week: 0.0 standard drinks  . Drug use: No  . Sexual activity: Not on file  Other Topics Concern  . Not on file  Social History Narrative  . Not on file   Social Determinants of Health   Financial Resource Strain:   . Difficulty of Paying Living Expenses: Not on file  Food Insecurity:   . Worried About Charity fundraiser in the Last Year: Not on file  . Ran Out of Food in the Last Year: Not on file  Transportation Needs:   . Lack of Transportation (Medical): Not on file  . Lack of Transportation (Non-Medical): Not  on file  Physical Activity:   . Days of Exercise per Week: Not on file  . Minutes of Exercise per Session: Not on file  Stress:   . Feeling of Stress : Not on file  Social Connections:   . Frequency of Communication with Friends and Family: Not on file  . Frequency of Social Gatherings with Friends and Family: Not on file  . Attends Religious Services: Not on file  . Active Member of Clubs or Organizations: Not on file  . Attends Archivist Meetings: Not on file  . Marital Status: Not on file  Intimate Partner Violence:   . Fear of Current or Ex-Partner: Not on file  . Emotionally Abused: Not on file  . Physically Abused: Not on file  . Sexually Abused: Not on file      Review of Systems  Constitutional:  Positive for fatigue. Negative for activity change, chills and unexpected weight change.  HENT: Negative for congestion, postnasal drip, rhinorrhea, sneezing and sore throat.   Respiratory: Positive for shortness of breath. Negative for cough and chest tightness.        Improved shortness of breath since his last visit.   Cardiovascular: Negative for chest pain and palpitations.       Improved blood pressure today  Gastrointestinal: Negative for abdominal pain, constipation, diarrhea, nausea and vomiting.  Endocrine: Negative for cold intolerance, heat intolerance, polydipsia and polyuria.  Musculoskeletal: Negative for arthralgias, back pain, joint swelling and neck pain.  Skin: Negative for rash.  Allergic/Immunologic: Negative for environmental allergies.  Neurological: Positive for tremors and weakness. Negative for numbness.       Right sided upper and lower extremity weakness. Tremor present in right hand with exertion.  The patient is now seeing neurology and diagnosed with parkinson's disease.   Hematological: Negative for adenopathy. Does not bruise/bleed easily.  Psychiatric/Behavioral: Negative for behavioral problems (Depression), dysphoric mood, sleep disturbance and suicidal ideas. The patient is not nervous/anxious.     Today's Vitals   05/03/20 1531  BP: (!) 132/92  Pulse: 89  Resp: 16  Temp: 98.2 F (36.8 C)  SpO2: 96%  Weight: 211 lb 6.4 oz (95.9 kg)  Height: 5\' 10"  (1.778 m)   Body mass index is 30.33 kg/m.  Physical Exam Vitals and nursing note reviewed.  Constitutional:      General: He is not in acute distress.    Appearance: Normal appearance. He is well-developed. He is not diaphoretic.  HENT:     Head: Normocephalic and atraumatic.     Mouth/Throat:     Pharynx: No oropharyngeal exudate.  Eyes:     Conjunctiva/sclera: Conjunctivae normal.     Pupils: Pupils are equal, round, and reactive to light.  Neck:     Thyroid: No thyromegaly.     Vascular:  No carotid bruit or JVD.     Trachea: No tracheal deviation.  Cardiovascular:     Rate and Rhythm: Normal rate and regular rhythm.     Pulses: Normal pulses.     Heart sounds: Normal heart sounds. No murmur heard.  No friction rub. No gallop.   Pulmonary:     Effort: Pulmonary effort is normal. No respiratory distress.     Breath sounds: Normal breath sounds. No wheezing or rales.  Chest:     Chest wall: No tenderness.  Abdominal:     Palpations: Abdomen is soft.  Musculoskeletal:        General: Normal range of motion.  Cervical back: Normal range of motion and neck supple.  Lymphadenopathy:     Cervical: No cervical adenopathy.  Skin:    General: Skin is warm and dry.     Capillary Refill: Capillary refill takes less than 2 seconds.  Neurological:     Mental Status: He is alert and oriented to person, place, and time. Mental status is at baseline.     Cranial Nerves: No cranial nerve deficit.     Comments: Mild/moderate tremor noted of the right arm. He carries the arm close to the body   Psychiatric:        Mood and Affect: Mood normal.        Behavior: Behavior normal.        Thought Content: Thought content normal.        Judgment: Judgment normal.   Assessment/Plan: 1. Dyspnea on exertion Reviewed results of echocardiogram with the patient which shows borderline left ventricular hypokinesis. Refer to cardiology for further evaluation.  - Ambulatory referral to Cardiology  2. Left ventricular hypokinesis Reviewed results of echocardiogram with the patient which shows borderline left ventricular hypokinesis. Refer to cardiology for further evaluation. Will also get baseline sleep study to evaluate for possible obstructive sleep apnea.  - PSG Sleep Study; Future - Ambulatory referral to Cardiology  3. Coronary artery disease due to lipid rich plaque Refer to cardiology for further evaluation.  - Ambulatory referral to Cardiology  4. Essential hypertension Start  losartan 25mg  tablets. Continue all other bp medication as prescribed.  - losartan (COZAAR) 25 MG tablet; Take 1 tablet (25 mg total) by mouth daily.  Dispense: 30 tablet; Refill: 2  General Counseling: [place] verbalizes understanding of the findings of todays visit and agrees with plan of treatment. I have discussed any further diagnostic evaluation that may be needed or ordered today. We also reviewed his medications today. he has been encouraged to call the office with any questions or concerns that should arise related to todays visit.  Cardiac risk factor modification:  1. Control blood pressure. 2. Exercise as prescribed. 3. Follow low sodium, low fat diet. and low fat and low cholestrol diet. 4. Take ASA 81mg  once a day. 5. Restricted calories diet to lose weight.  This patient was seen by Leretha Pol FNP Collaboration with Dr Lavera Guise as a part of collaborative care agreement  Orders Placed This Encounter  Procedures  . Ambulatory referral to Cardiology  . PSG Sleep Study    Meds ordered this encounter  Medications  . losartan (COZAAR) 25 MG tablet    Sig: Take 1 tablet (25 mg total) by mouth daily.    Dispense:  30 tablet    Refill:  2    Order Specific Question:   Supervising Provider    Answer:   Lavera Guise [[id number]]    Total time spent: 30 Minutes  Time spent includes review of chart, medications, test results, and follow up plan with the patient.      Dr Lavera Guise Internal medicine

## 2020-05-06 DIAGNOSIS — I2510 Atherosclerotic heart disease of native coronary artery without angina pectoris: Secondary | ICD-10-CM | POA: Insufficient documentation

## 2020-05-06 DIAGNOSIS — I2583 Coronary atherosclerosis due to lipid rich plaque: Secondary | ICD-10-CM | POA: Insufficient documentation

## 2020-05-06 DIAGNOSIS — I5189 Other ill-defined heart diseases: Secondary | ICD-10-CM | POA: Insufficient documentation

## 2020-05-13 NOTE — Progress Notes (Signed)
Assessment/Plan:  1.  Idiopathic Parkinson's disease, akinetic rigid type.    -We discussed the diagnosis as well as pathophysiology of the disease.  We discussed treatment options as well as prognostic indicators.  Patient education was provided.  -We discussed that it used to be thought that levodopa would increase risk of melanoma but now it is believed that Parkinsons itself likely increases risk of melanoma. he is to get regular skin checks.  -Greater than 50% of the 60 minute visit was spent in counseling answering questions and talking about what to expect now as well as in the future.  We talked about medication options as well as potential future surgical options.  We talked about safety in the home.  -Patient currently under dosed.  Most of the issue is that he gets up very early to be at work.  He has a very long day.  He will change dosing, so that he now takes carbidopa/levodopa 25/100, 2 tablets at 5 AM, 1 tablet at 9 AM/1 PM/5 PM  -He will discontinue carbidopa/levodopa 25/100 CR, 1 tablet twice per day  -He will take carbidopa/levodopa 50/200 at bedtime (he did notice bedtime levodopa was helping cramping).  -I would like him to participate in [place] program, but he is currently working full-time and unable to do so.  -Gave him information to the [employer] rock steady boxing program  -We discussed community resources in the area including patient support groups and community exercise programs for PD and pt education was provided to the patient.  -Discussed at length whether or not working full-time was advantageous for him.  He may need to consider going part-time, or even consider disability.  He is quite slow, and his work has approached him as well.  If he does go out on disability, I did discuss with him that he will need to exercise and find things to occupy his time so that he does not get depressed.  Discussed this at length with patient and son.   Subjective:    James Bush was seen today in the movement disorders clinic for neurologic consultation at the request of Jannifer Franklin, NP.  The consultation is for the evaluation of PD.  This patient is accompanied in the office by his son who supplements the history.  Outside records that were made available to me were reviewed.  Patient first saw Dr. Melrose Nakayama for the diagnosis in July, 2019.  Symptoms started 2 years prior to that.    02/2018:  He was diagnosed with Parkinson's disease and started on carbidopa/levodopa 25/100 CR, 1 tablet in the morning and 1 tablet at bedtime. 8/19, 2/20:  F/U neuro - no med change 9/20: d/c carbidopa/levodopa CR.  Start carbidopa/levodopa 25/100 tid 9/21:  Take carbidopa/levodopa 25/100 tid (5am/noon/5pm); take carbidopa/levodopa CR 1 in the AM (5am) and 1 q hs (5pm) - states that this long acting form helping the cramping and rolling in bed but didn't change daytime slowness  Specific Symptoms:  Tremor: Yes.  , rarely if puts pressure on the R arm Family hx of similar:  Yes.  , maternal GM with Parkinsons Disease  Voice: little slurring of speech Sleep:   Vivid Dreams:  No.  Acting out dreams:  Yes.  , some yelling out Wet Pillows: Yes.  , some Postural symptoms:  Yes.  , but better than it was  Falls?  No. Bradykinesia symptoms: slow movements, drooling while awake and difficulty getting out of a chair  Loss of smell:  Yes.   Loss of taste:  No. Urinary Incontinence:  No. Difficulty Swallowing:  No. Handwriting, micrographia: Yes.   Trouble with ADL's:  No.  Trouble buttoning clothing: Yes.   Depression:  No. Memory changes:  No. Hallucinations:  No.  visual distortions: Yes.   N/V:  No. Lightheaded:  No.  Syncope: No. Diplopia:  No. Dyskinesia:  No. Prior exposure to reglan/antipsychotics: No.  Neuroimaging of the brain has  previously been performed.  It is not available for my review today.  Patient had MRI of the brain done in October, 2018 as well  as MRA brain.  MRA brain was normal.  MRI brain was reported to show few T2 hyperintensities.  PREVIOUS MEDICATIONS: Sinemet  ALLERGIES:  No Known Allergies  CURRENT MEDICATIONS:  Current Outpatient Medications  Medication Instructions  . albuterol (VENTOLIN HFA) 108 (90 Base) MCG/ACT inhaler 2 puffs, Inhalation, Every 6 hours PRN  . aspirin 81 mg, Oral, Daily  . carbidopa-levodopa (SINEMET IR) 25-100 MG tablet 1 tablet, Oral, 3 times daily  . Carbidopa-Levodopa ER (SINEMET CR) 25-100 MG tablet controlled release 1 tablet, Oral, 2 times daily  . hydrochlorothiazide (HYDRODIURIL) 25 MG tablet TAKE 1 TABLET(S) BY MOUTH DAILY FOR BLOOD PRESSURE  . losartan (COZAAR) 25 mg, Oral, Daily  . metoprolol tartrate (LOPRESSOR) 25 mg, Oral, 2 times daily  . Multiple Vitamin (MULTIVITAMIN WITH MINERALS) TABS tablet 1 tablet, Oral, Daily  . simvastatin (ZOCOR) 40 MG tablet TAKE 1 TABLET BY MOUTH EVERY DAY AT NIGHT    Objective:   VITALS:   Vitals:   05/16/20 0920  BP: 132/90  Pulse: 72  SpO2: 97%  Weight: 209 lb (94.8 kg)  Height: 5\' 10"  (1.778 m)    GEN:  The patient appears stated age and is in NAD. HEENT:  Normocephalic, atraumatic.  The mucous membranes are moist. The superficial temporal arteries are without ropiness or tenderness. CV:  RRR Lungs:  CTAB Neck/HEME:  There are no carotid bruits bilaterally.  Neurological examination:  Orientation: The patient is alert and oriented x3.  Cranial nerves: There is good facial symmetry. There is facial hypomimia.  Extraocular muscles are intact. The visual fields are full to confrontational testing. The speech is fluent and clear. Soft palate rises symmetrically and there is no tongue deviation. Hearing is intact to conversational tone. Sensation: Sensation is intact to light and pinprick throughout (facial, trunk, extremities). Vibration is intact at the bilateral big toe. There is no extinction with double simultaneous stimulation. There  is no sensory dermatomal level identified. Motor: Strength is 5/5 in the bilateral upper and lower extremities.   Shoulder shrug is equal and symmetric.  There is no pronator drift. Deep tendon reflexes: Deep tendon reflexes are 2/4 at the bilateral biceps, triceps, brachioradialis, patella and achilles. Plantar responses are downgoing bilaterally.  Movement examination: Tone: There is mod increased tone in the RUE and RLE.  There is mild to mod increased tone in the LLE and nl in the LUE Abnormal movements: none even with distraction Coordination:  There is decremation with RAM's, with any form of RAMS, including alternating supination and pronation of the forearm, hand opening and closing, finger taps, heel taps and toe taps, mostly on the L Gait and Station: The patient has no difficulty arising out of a deep-seated chair without the use of the hands. The patient's stride length is decreased with flexed arm on the right.  The patient has a negative pull test.  I have reviewed and interpreted the following labs independently   Chemistry      Component Value Date/Time   NA 140 03/28/2020 0922   K 4.3 03/28/2020 0922   CL 101 03/28/2020 0922   CO2 30 03/28/2020 0922   BUN 20 03/28/2020 0922   CREATININE 1.08 03/28/2020 0922      Component Value Date/Time   CALCIUM 9.1 03/28/2020 0922   ALKPHOS 44 03/28/2020 0922   AST 16 03/28/2020 0922   ALT <5 03/28/2020 0922   BILITOT 1.0 03/28/2020 0922      Lab Results  Component Value Date   TSH 1.041 03/28/2020   Lab Results  Component Value Date   WBC 6.3 03/28/2020   HGB 13.7 03/28/2020   HCT 41.9 03/28/2020   MCV 94.2 03/28/2020   PLT 219 03/28/2020     Total time spent on today's visit was 80 minutes, including both face-to-face time and nonface-to-face time (60 min FTF).  Time included that spent on review of records (prior notes available to me/labs/imaging if pertinent), discussing treatment and goals, answering patient's  questions and coordinating care.  Cc:  Lavera Guise, MD

## 2020-05-16 ENCOUNTER — Other Ambulatory Visit: Payer: Self-pay

## 2020-05-16 ENCOUNTER — Ambulatory Visit: Payer: No Typology Code available for payment source | Admitting: Neurology

## 2020-05-16 ENCOUNTER — Encounter: Payer: Self-pay | Admitting: Neurology

## 2020-05-16 VITALS — BP 132/90 | HR 72 | Ht 70.0 in | Wt 209.0 lb

## 2020-05-16 DIAGNOSIS — G20 Parkinson's disease: Secondary | ICD-10-CM

## 2020-05-16 MED ORDER — CARBIDOPA-LEVODOPA ER 50-200 MG PO TBCR
1.0000 | EXTENDED_RELEASE_TABLET | Freq: Every day | ORAL | 1 refills | Status: DC
Start: 2020-05-16 — End: 2020-09-27

## 2020-05-16 MED ORDER — CARBIDOPA-LEVODOPA 25-100 MG PO TABS: ORAL_TABLET | ORAL | 1 refills | Status: DC

## 2020-05-16 NOTE — Patient Instructions (Addendum)
Take your medications as follows:  1.  Take carbidopa/levodopa 25/100 (yellow pill), 2 tablets at 5am/ and then 1 tablet at9am/1pm/5pm.   As a reminder, carbidopa/levodopa can be taken at the same time as a carbohydrate, but we like to have you take your pill either 30 minutes before a protein source or 1 hour after as protein can interfere with carbidopa/levodopa absorption. 2.  Take carbidopa/levodopa 50/200 CR at bedtime 3.  Exercise!  Look into rock steady boxing in [place] and staff at [facility] are committed to providing excellent care. You may receive a survey requesting feedback about your experience at our office. We strive to receive "very good" responses to the survey questions. If you feel that your experience would prevent you from giving the office a "very good " response, please contact our office to try to remedy the situation. We may be reached at [phone]. Thank you for taking the time out of your busy day to complete the survey.

## 2020-06-20 ENCOUNTER — Other Ambulatory Visit: Payer: No Typology Code available for payment source

## 2020-06-20 ENCOUNTER — Other Ambulatory Visit: Payer: Self-pay

## 2020-06-20 DIAGNOSIS — C61 Malignant neoplasm of prostate: Secondary | ICD-10-CM

## 2020-06-21 LAB — PSA TOTAL (REFLEX TO FREE): Prostate Specific Ag, Serum: 7.9 ng/mL — ABNORMAL HIGH (ref 0.0–4.0)

## 2020-06-21 LAB — FPSA% REFLEX
% FREE PSA: 25.3 %
PSA, FREE: 2 ng/mL

## 2020-06-27 ENCOUNTER — Encounter: Payer: Self-pay | Admitting: Urology

## 2020-06-27 ENCOUNTER — Other Ambulatory Visit: Payer: Self-pay

## 2020-06-27 ENCOUNTER — Ambulatory Visit: Payer: No Typology Code available for payment source | Admitting: Urology

## 2020-06-27 VITALS — BP 117/71 | HR 76 | Ht 70.0 in | Wt 218.0 lb

## 2020-06-27 DIAGNOSIS — N138 Other obstructive and reflux uropathy: Secondary | ICD-10-CM | POA: Diagnosis not present

## 2020-06-27 DIAGNOSIS — C61 Malignant neoplasm of prostate: Secondary | ICD-10-CM

## 2020-06-27 DIAGNOSIS — N401 Enlarged prostate with lower urinary tract symptoms: Secondary | ICD-10-CM | POA: Diagnosis not present

## 2020-06-27 LAB — BLADDER SCAN AMB NON-IMAGING

## 2020-06-27 NOTE — Patient Instructions (Signed)
Avoid bladder irritants like caffeine, soda, diet drinks, and tea.  Urinate twice before going to bed and minimize fluids 3 to 4 hours before bedtime.

## 2020-06-27 NOTE — Progress Notes (Signed)
   06/27/2020 9:41 AM   James Bush [birth year] [id number]  Reason for visit: Follow upvery low risk prostate cancer, BPH  HPI: I saw James Bush urology clinic today for follow-up of his very low risk prostate cancer. Briefly he is a 59 year old African-American male with Parkinson's disease in the  who was diagnosed with very low risk prostate cancer in March 2017. He does have a family history of non-lethal prostate cancer in his uncle. PSA at time of initial diagnosis was 6.25, and biopsy showed 1/12 cores positive for Gleason score 3+3=6 disease. Confirmatory biopsy in October 2017 showed 2/12 cores of 3+3=6 disease, with max core involvement of 33%. Prostate volume was 53 g, with PSA density of0.13.  PSA  October 2021 is stable at 7.9 from 7.1 last year, 7.6 in May 2019, 7 in April 2018, and 8.5 in October 2017.  DRE today 60 g, smooth, slightly asymmetric on the right side but unchanged from prior.  He continues to have mild urinary symptoms of some weak stream during the day and nocturia once per night.  We discussed options including behavioral strategies or trial of Flomax, but he is not bothered enough at this time to consider medications.  PVR is normal at 12 mL.  His Parkinson's disease is relatively stable, though his neurologist recently increased his medication dosing.   -Continue active surveillance, we discussed with his diagnosis of Parkinson's disease I would not recommend aggressive surveillance or intervention for his very low risk prostate cancer.  We discussed the very low, but not zero, risk of developing metastatic disease while on surveillance.  I recommended continuing PSA/DRE screening on a yearly basis.  If the PSA rose significantly in the future would consider prostate MRI or prostate biopsy. -RTC 1 year PSA/DRE/IPSS/PVR  Nickolas Madrid, MD 06/27/2020

## 2020-07-29 ENCOUNTER — Other Ambulatory Visit: Payer: Self-pay

## 2020-07-29 DIAGNOSIS — I10 Essential (primary) hypertension: Secondary | ICD-10-CM

## 2020-07-29 MED ORDER — LOSARTAN POTASSIUM 25 MG PO TABS: 25.0000 mg | ORAL_TABLET | Freq: Every day | ORAL | 2 refills | Status: DC

## 2020-07-31 ENCOUNTER — Other Ambulatory Visit: Payer: Self-pay

## 2020-07-31 DIAGNOSIS — R0602 Shortness of breath: Secondary | ICD-10-CM

## 2020-07-31 DIAGNOSIS — I10 Essential (primary) hypertension: Secondary | ICD-10-CM

## 2020-07-31 MED ORDER — METOPROLOL TARTRATE 25 MG PO TABS: 25.0000 mg | ORAL_TABLET | Freq: Two times a day (BID) | ORAL | 1 refills | Status: DC

## 2020-07-31 MED ORDER — ALBUTEROL SULFATE HFA 108 (90 BASE) MCG/ACT IN AERS: 2.0000 | INHALATION_SPRAY | Freq: Four times a day (QID) | RESPIRATORY_TRACT | 5 refills | Status: DC | PRN

## 2020-08-08 ENCOUNTER — Other Ambulatory Visit: Payer: Self-pay

## 2020-08-08 DIAGNOSIS — I10 Essential (primary) hypertension: Secondary | ICD-10-CM

## 2020-08-08 MED ORDER — HYDROCHLOROTHIAZIDE 25 MG PO TABS: ORAL_TABLET | ORAL | 1 refills | Status: DC

## 2020-09-26 ENCOUNTER — Other Ambulatory Visit: Payer: Self-pay | Admitting: Neurology

## 2020-09-27 ENCOUNTER — Other Ambulatory Visit: Payer: Self-pay

## 2020-09-27 DIAGNOSIS — I10 Essential (primary) hypertension: Secondary | ICD-10-CM

## 2020-09-27 MED ORDER — LOSARTAN POTASSIUM 25 MG PO TABS: 25.0000 mg | ORAL_TABLET | Freq: Every day | ORAL | 2 refills | Status: DC

## 2020-09-27 NOTE — Telephone Encounter (Signed)
Rx(s) sent to pharmacy electronically.  

## 2020-11-18 NOTE — Progress Notes (Signed)
Assessment/Plan:   1.  Parkinsons Disease, diagnosed July, 2019 with symptoms 2 years prior  -Continue carbidopa/levodopa 25/100, 2 tablets at 5 AM, 1 tablet at 9 AM/1 PM/5 PM  -Continue carbidopa/levodopa 50/200 at bedtime  -We discussed adding pramipexole, but also discussed the risks of increasing daytime sleepiness.  Ultimately, he really did not want to change anything.  He works and worries about ability to continue to do so.  We can discuss this further in the future if needed.  2.  REM behavior disorder  -This is commonly associated with PD and the patient is experiencing this.  We discussed that this can be very serious and even harmful.  We talked about medications as well as physical barriers to put in the bed (particularly soft bed rails, pillow barriers).  We talked about moving the night stand so that it is not so close to the side of the bed.  3.  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  He is on 2 different blood pressure medications.  May need to be lowered some.  -Talked about increasing water intake.  Subjective:   James Bush was seen today in follow up for Parkinsons disease.  My previous records were reviewed prior to todays visit as well as outside records available to me.  Patient with his son who supplements the history.  We changed around his medications last visit and he reports that he is doing fairly well.  Some stiffness in the R leg.  pt denies falls.  Pt with some lightheadedness, near syncope.  No hallucinations.  Mood has been good.  He is walking for exercising.  Some fatigue in the day.  Some yelling out at night.    Current prescribed movement disorder medications: carbidopa/levodopa 25/100, 2 tablets at 5 AM, 1 tablet at 9 AM/1 PM/5 PM (started this dosing schedule last visit) Carbidopa/levodopa 50/200 at bedtime   PREVIOUS MEDICATIONS: Carbidopa/levodopa 25/100  CR (I discontinued this when I first started seeing him to change him to the IR)  ALLERGIES:  No Known Allergies  CURRENT MEDICATIONS:  Outpatient Encounter Medications as of 11/21/2020  Medication Sig  . albuterol (VENTOLIN HFA) 108 (90 Base) MCG/ACT inhaler Inhale 2 puffs into the lungs every 6 (six) hours as needed for wheezing or shortness of breath.  Marland Kitchen aspirin 81 MG tablet Take 81 mg by mouth daily.  . carbidopa-levodopa (SINEMET CR) 50-200 MG tablet TAKE 1 TABLET BY MOUTH EVERYDAY AT BEDTIME  . carbidopa-levodopa (SINEMET IR) 25-100 MG tablet 2 tablets at 5am/ 1 tablet at 9am/1pm/5pm.  . hydrochlorothiazide (HYDRODIURIL) 25 MG tablet TAKE 1 TABLET(S) BY MOUTH DAILY FOR BLOOD PRESSURE  . losartan (COZAAR) 25 MG tablet Take 1 tablet (25 mg total) by mouth daily.  . metoprolol tartrate (LOPRESSOR) 25 MG tablet Take 1 tablet (25 mg total) by mouth 2 (two) times daily.  . simvastatin (ZOCOR) 40 MG tablet TAKE 1 TABLET BY MOUTH EVERY DAY AT NIGHT  . Multiple Vitamin (MULTIVITAMIN WITH MINERALS) TABS tablet Take 1 tablet by mouth daily. (Patient not taking: Reported on 11/21/2020)   No facility-administered encounter medications on file as of 11/21/2020.    Objective:   PHYSICAL EXAMINATION:    VITALS:   Vitals:   11/21/20 1025  BP: 114/74  Pulse: 71  SpO2: 98%  Weight: 221 lb (100.2 kg)  Height: 5\' 10"  (1.778 m)    GEN:  The patient appears stated  age and is in NAD. HEENT:  Normocephalic, atraumatic.  The mucous membranes are moist. The superficial temporal arteries are without ropiness or tenderness. CV:  RRR Lungs:  CTAB Neck/HEME:  There are no carotid bruits bilaterally.  Neurological examination:  Orientation: The patient is alert and oriented x3. Cranial nerves: There is good facial symmetry with minimal facial hypomimia. The speech is fluent and clear. Soft palate rises symmetrically and there is no tongue deviation. Hearing is intact to conversational tone. Sensation:  Sensation is intact to light touch throughout Motor: Strength is at least antigravity x4.  Movement examination: Tone: There is  mild to moderate in the right upper extremity.  Tone in the left upper extremity is good.  Abnormal movements: none even with distraction Coordination:  There is mild decremation with RAM's, with any form of RAMS, including alternating supination and pronation of the forearm, hand opening and closing, finger taps, heel taps and toe taps, on the right Gait and Station: The patient has no difficulty arising out of a deep-seated chair without the use of the hands.  Patient slightly drags of the leg on the right. The patient has a negative pull test.       I have reviewed and interpreted the following labs independently    Chemistry      Component Value Date/Time   NA 140 03/28/2020 0922   K 4.3 03/28/2020 0922   CL 101 03/28/2020 0922   CO2 30 03/28/2020 0922   BUN 20 03/28/2020 0922   CREATININE 1.08 03/28/2020 0922      Component Value Date/Time   CALCIUM 9.1 03/28/2020 0922   ALKPHOS 44 03/28/2020 0922   AST 16 03/28/2020 0922   ALT <5 03/28/2020 0922   BILITOT 1.0 03/28/2020 0922       Lab Results  Component Value Date   WBC 6.3 03/28/2020   HGB 13.7 03/28/2020   HCT 41.9 03/28/2020   MCV 94.2 03/28/2020   PLT 219 03/28/2020    Lab Results  Component Value Date   TSH 1.041 03/28/2020     Total time spent on today's visit was 30 minutes, including both face-to-face time and nonface-to-face time.  Time included that spent on review of records (prior notes available to me/labs/imaging if pertinent), discussing treatment and goals, answering patient's questions and coordinating care.  Cc:  Lavera Guise, MD

## 2020-11-21 ENCOUNTER — Other Ambulatory Visit: Payer: Self-pay

## 2020-11-21 ENCOUNTER — Encounter: Payer: Self-pay | Admitting: Neurology

## 2020-11-21 ENCOUNTER — Ambulatory Visit: Payer: No Typology Code available for payment source | Admitting: Neurology

## 2020-11-21 VITALS — BP 114/74 | HR 71 | Ht 70.0 in | Wt 221.0 lb

## 2020-11-21 DIAGNOSIS — G20 Parkinson's disease: Secondary | ICD-10-CM

## 2020-11-21 MED ORDER — CARBIDOPA-LEVODOPA ER 50-200 MG PO TBCR: 1.0000 | EXTENDED_RELEASE_TABLET | Freq: Every day | ORAL | 1 refills | Status: DC

## 2020-11-21 MED ORDER — CARBIDOPA-LEVODOPA 25-100 MG PO TABS: ORAL_TABLET | ORAL | 1 refills | Status: DC

## 2020-11-21 NOTE — Patient Instructions (Signed)
1.  Increase water intake 2.  Follow up with PCP about your blood pressure and dizziness  The physicians and staff at [facility] are committed to providing excellent care. You may receive a survey requesting feedback about your experience at our office. We strive to receive "very good" responses to the survey questions. If you feel that your experience would prevent you from giving the office a "very good " response, please contact our office to try to remedy the situation. We may be reached at [phone]. Thank you for taking the time out of your busy day to complete the survey.

## 2020-11-22 ENCOUNTER — Other Ambulatory Visit: Payer: Self-pay | Admitting: Nurse Practitioner

## 2020-11-22 ENCOUNTER — Other Ambulatory Visit: Payer: Self-pay | Admitting: Hospice and Palliative Medicine

## 2020-11-22 DIAGNOSIS — I10 Essential (primary) hypertension: Secondary | ICD-10-CM

## 2021-01-17 ENCOUNTER — Other Ambulatory Visit: Payer: Self-pay

## 2021-01-17 DIAGNOSIS — I10 Essential (primary) hypertension: Secondary | ICD-10-CM

## 2021-01-17 MED ORDER — LOSARTAN POTASSIUM 25 MG PO TABS: 25.0000 mg | ORAL_TABLET | Freq: Every day | ORAL | 0 refills | Status: DC

## 2021-01-23 ENCOUNTER — Ambulatory Visit: Payer: No Typology Code available for payment source | Admitting: Nurse Practitioner

## 2021-01-23 ENCOUNTER — Other Ambulatory Visit: Payer: Self-pay

## 2021-01-23 ENCOUNTER — Encounter: Payer: Self-pay | Admitting: Nurse Practitioner

## 2021-01-23 VITALS — BP 134/88 | HR 67 | Temp 97.9°F | Resp 16 | Ht 70.0 in | Wt 222.8 lb

## 2021-01-23 DIAGNOSIS — N539 Unspecified male sexual dysfunction: Secondary | ICD-10-CM | POA: Diagnosis not present

## 2021-01-23 DIAGNOSIS — I10 Essential (primary) hypertension: Secondary | ICD-10-CM | POA: Diagnosis not present

## 2021-01-23 DIAGNOSIS — E782 Mixed hyperlipidemia: Secondary | ICD-10-CM | POA: Diagnosis not present

## 2021-01-23 DIAGNOSIS — G20 Parkinson's disease: Secondary | ICD-10-CM

## 2021-01-23 MED ORDER — AMLODIPINE BESYLATE 5 MG PO TABS: 5.0000 mg | ORAL_TABLET | Freq: Every day | ORAL | 0 refills | Status: DC

## 2021-01-23 NOTE — Progress Notes (Signed)
[facility] [place], [place] [address]  Internal MEDICINE  Office Visit Note  Patient Name: James Bush  [id number]  [id number]  Date of Service: 01/26/2021  Chief Complaint  Patient presents with  . Medication Refill    HPI  James Bush presents for follow up visit regarding hypertension and medication refills. He has a history of asthma, hyperlipidemia, and Parkinson's disease. He reports that he has been having difficulty with having erections and it has been impacting his intimacy with his significant other. He is currently taking metoprolol for hypertension which can cause erectile dysfunction.  -blood pressure is well controlled but metoprolol can be changed since he is having adverse side effect.  -history of hyperlipidemia, he is taking simvastatin, lipid panel is stable. -patient has parkinson's disease and is taking sinemet for this. -needs to schedule annual physical exam.   Current Medication: Outpatient Encounter Medications as of 01/23/2021  Medication Sig  . albuterol (VENTOLIN HFA) 108 (90 Base) MCG/ACT inhaler Inhale 2 puffs into the lungs every 6 (six) hours as needed for wheezing or shortness of breath.  Marland Kitchen amLODipine (NORVASC) 5 MG tablet Take 1 tablet (5 mg total) by mouth daily.  Marland Kitchen aspirin 81 MG tablet Take 81 mg by mouth daily.  . carbidopa-levodopa (SINEMET CR) 50-200 MG tablet Take 1 tablet by mouth at bedtime.  . carbidopa-levodopa (SINEMET IR) 25-100 MG tablet 2 tablets at 5am/ 1 tablet at 9am/1pm/5pm.  . hydrochlorothiazide (HYDRODIURIL) 25 MG tablet TAKE 1 TABLET(S) BY MOUTH DAILY FOR BLOOD PRESSURE  . losartan (COZAAR) 25 MG tablet Take 1 tablet (25 mg total) by mouth daily.  . Multiple Vitamin (MULTIVITAMIN WITH MINERALS) TABS tablet Take 1 tablet by mouth daily.  . [DISCONTINUED] metoprolol tartrate (LOPRESSOR) 25 MG tablet Take 1 tablet (25 mg total) by mouth 2 (two) times daily.  . [DISCONTINUED] simvastatin (ZOCOR) 40 MG tablet  TAKE 1 TABLET BY MOUTH EVERY DAY AT NIGHT  . simvastatin (ZOCOR) 40 MG tablet TAKE 1 TABLET BY MOUTH EVERY DAY AT NIGHT   No facility-administered encounter medications on file as of 01/23/2021.    Surgical History: Past Surgical History:  Procedure Laterality Date  . CARDIAC ELECTROPHYSIOLOGY STUDY AND ABLATION    . EYE SURGERY     strabismus as child  . KNEE ARTHROPLASTY Right     Medical History: Past Medical History:  Diagnosis Date  . Asthma   . Heart murmur   . Hyperlipidemia   . Hypertension   . Parkinson disease ([place])     Family History: Family History  Problem Relation Age of Onset  . COPD Mother   . Multiple sclerosis Son   . Healthy Daughter   . Healthy Daughter   . Hematuria Neg Hx   . Kidney cancer Neg Hx   . Prostate cancer Neg Hx     Social History   Socioeconomic History  . Marital status: Married    Spouse name: Not on file  . Number of children: Not on file  . Years of education: Not on file  . Highest education level: Not on file  Occupational History  . Not on file  Tobacco Use  . Smoking status: Never Smoker  . Smokeless tobacco: Never Used  Vaping Use  . Vaping Use: Never used  Substance and Sexual Activity  . Alcohol use: Yes    Alcohol/week: 0.0 standard drinks    Comment: 1 beer/month  . Drug use: No  . Sexual activity: Not on  file  Other Topics Concern  . Not on file  Social History Narrative  . Not on file   Social Determinants of Health   Financial Resource Strain: Not on file  Food Insecurity: Not on file  Transportation Needs: Not on file  Physical Activity: Not on file  Stress: Not on file  Social Connections: Not on file  Intimate Partner Violence: Not on file      Review of Systems  Constitutional: Negative for chills, fatigue and unexpected weight change.  HENT: Negative for congestion, rhinorrhea, sneezing and sore throat.   Eyes: Negative for redness.  Respiratory: Negative for cough, chest tightness  and shortness of breath.   Cardiovascular: Negative for chest pain and palpitations.  Gastrointestinal: Negative for abdominal pain, constipation, diarrhea, nausea and vomiting.  Genitourinary: Negative for dysuria and frequency.       Erectile dysfunction  Musculoskeletal: Negative for arthralgias, back pain, joint swelling and neck pain.  Skin: Negative for rash.  Neurological: Negative.  Negative for tremors and numbness.  Hematological: Negative for adenopathy. Does not bruise/bleed easily.  Psychiatric/Behavioral: Negative for behavioral problems (Depression), sleep disturbance and suicidal ideas. The patient is not nervous/anxious.     Vital Signs: BP 134/88   Pulse 67   Temp 97.9 F (36.6 C)   Resp 16   Ht 5\' 10"  (1.778 m)   Wt 222 lb 12.8 oz (101.1 kg)   SpO2 98%   BMI 31.97 kg/m    Physical Exam Vitals reviewed.  Constitutional:      General: He is not in acute distress.    Appearance: Normal appearance. He is well-developed. He is obese. He is not ill-appearing or diaphoretic.  HENT:     Head: Normocephalic and atraumatic.  Neck:     Thyroid: No thyromegaly.     Vascular: No JVD.     Trachea: No tracheal deviation.  Cardiovascular:     Rate and Rhythm: Normal rate and regular rhythm.     Pulses: Normal pulses.     Heart sounds: Normal heart sounds. No murmur heard. No friction rub. No gallop.   Pulmonary:     Effort: Pulmonary effort is normal. No respiratory distress.     Breath sounds: Normal breath sounds. No wheezing or rales.  Chest:     Chest wall: No tenderness.  Musculoskeletal:     Cervical back: Normal range of motion and neck supple.  Lymphadenopathy:     Cervical: No cervical adenopathy.  Skin:    General: Skin is warm and dry.     Capillary Refill: Capillary refill takes less than 2 seconds.  Neurological:     Mental Status: He is alert and oriented to person, place, and time.     Cranial Nerves: No cranial nerve deficit.  Psychiatric:         Behavior: Behavior normal.        Thought Content: Thought content normal.        Judgment: Judgment normal.    Assessment/Plan: 1. Essential hypertension Blood pressure is well controlled with current medications. He is experiencing unwanted adverse side effect of metoprolol. Metoprolol was discontinued. Started patient on amlodipine 5 mg daily. Continue losartan 25 mg daily. - amLODipine (NORVASC) 5 MG tablet; Take 1 tablet (5 mg total) by mouth daily.  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 Metoprolol was discontinued. Patient instructed to call the clinic if he is still having ED.  3. Mixed hyperlipidemia  Stable with simvastatin.  - simvastatin (ZOCOR) 40 MG tablet; TAKE 1 TABLET BY MOUTH EVERY DAY AT NIGHT  Dispense: 90 tablet; Refill: 3  4. Parkinson's disease ([place]) Managed by neurology. Stable on current medication.    General Counseling: kekai geter understanding of the findings of todays visit and agrees with plan of treatment. I have discussed any further diagnostic evaluation that may be needed or ordered today. We also reviewed his medications today. he has been encouraged to call the office with any questions or concerns that should arise related to todays visit.    No orders of the defined types were placed in this encounter.   Meds ordered this encounter  Medications  . amLODipine (NORVASC) 5 MG tablet    Sig: Take 1 tablet (5 mg total) by mouth daily.    Dispense:  90 tablet    Refill:  0  . simvastatin (ZOCOR) 40 MG tablet    Sig: TAKE 1 TABLET BY MOUTH EVERY DAY AT NIGHT    Dispense:  90 tablet    Refill:  3   Return in about 4 weeks (around 02/20/2021) for F/U blood pressure , Jacinto Keil PCP.  Total time spent:30 Minutes Time spent includes review of chart, medications, test results, and follow up plan with the patient.   [place] Controlled Substance Database was reviewed by me. This  patient was seen by Jonetta Osgood, FNP-C in collaboration with Dr. Clayborn Bigness as a part of collaborative care agreement.   Jonetta Osgood, MSN, FNP-C Internal medicine

## 2021-01-26 MED ORDER — SIMVASTATIN 40 MG PO TABS: ORAL_TABLET | ORAL | 3 refills | Status: DC

## 2021-02-20 ENCOUNTER — Other Ambulatory Visit: Payer: Self-pay

## 2021-02-20 ENCOUNTER — Ambulatory Visit: Payer: No Typology Code available for payment source | Admitting: Internal Medicine

## 2021-02-20 ENCOUNTER — Encounter: Payer: Self-pay | Admitting: Nurse Practitioner

## 2021-02-20 VITALS — BP 128/94 | HR 78 | Temp 98.2°F | Resp 16 | Ht 70.0 in | Wt 222.4 lb

## 2021-02-20 DIAGNOSIS — I10 Essential (primary) hypertension: Secondary | ICD-10-CM | POA: Diagnosis not present

## 2021-02-20 DIAGNOSIS — N528 Other male erectile dysfunction: Secondary | ICD-10-CM | POA: Diagnosis not present

## 2021-02-20 DIAGNOSIS — I5189 Other ill-defined heart diseases: Secondary | ICD-10-CM | POA: Diagnosis not present

## 2021-02-20 DIAGNOSIS — G479 Sleep disorder, unspecified: Secondary | ICD-10-CM

## 2021-02-20 MED ORDER — SILDENAFIL CITRATE 100 MG PO TABS: 50.0000 mg | ORAL_TABLET | Freq: Every day | ORAL | 4 refills | Status: DC | PRN

## 2021-02-20 MED ORDER — LOSARTAN POTASSIUM-HCTZ 50-12.5 MG PO TABS: 1.0000 | ORAL_TABLET | Freq: Every day | ORAL | 1 refills | Status: DC

## 2021-02-20 NOTE — Progress Notes (Signed)
[facility] [place], [place] [address]  Internal MEDICINE  Office Visit Note  Patient Name: James Bush  [id number]  [id number]  Date of Service: 03/03/2021  Chief Complaint  Patient presents with   Follow-up    Bp check    Quality Metric Gaps    Colonoscopy    HPI Pt is here for routine follow up BP continues to be elevated, his echo has shown normal low EF, He has not seen cardiologist  Continues to have problem maintaining his sleep  Daytime fatigue at times. C/O ED as well Has PD followed by neurology   Current Medication: Outpatient Encounter Medications as of 02/20/2021  Medication Sig   albuterol (VENTOLIN HFA) 108 (90 Base) MCG/ACT inhaler Inhale 2 puffs into the lungs every 6 (six) hours as needed for wheezing or shortness of breath.   amLODipine (NORVASC) 5 MG tablet Take 1 tablet (5 mg total) by mouth daily.   aspirin 81 MG tablet Take 81 mg by mouth daily.   carbidopa-levodopa (SINEMET CR) 50-200 MG tablet Take 1 tablet by mouth at bedtime.   carbidopa-levodopa (SINEMET IR) 25-100 MG tablet 2 tablets at 5am/ 1 tablet at 9am/1pm/5pm.   losartan-hydrochlorothiazide (HYZAAR) 50-12.5 MG tablet Take 1 tablet by mouth daily.   Multiple Vitamin (MULTIVITAMIN WITH MINERALS) TABS tablet Take 1 tablet by mouth daily.   sildenafil (VIAGRA) 100 MG tablet Take 0.5-1 tablets (50-100 mg total) by mouth daily as needed for erectile dysfunction.   simvastatin (ZOCOR) 40 MG tablet TAKE 1 TABLET BY MOUTH EVERY DAY AT NIGHT   [DISCONTINUED] hydrochlorothiazide (HYDRODIURIL) 25 MG tablet TAKE 1 TABLET(S) BY MOUTH DAILY FOR BLOOD PRESSURE   [DISCONTINUED] losartan (COZAAR) 25 MG tablet Take 1 tablet (25 mg total) by mouth daily.   No facility-administered encounter medications on file as of 02/20/2021.    Surgical History: Past Surgical History:  Procedure Laterality Date   CARDIAC ELECTROPHYSIOLOGY STUDY AND ABLATION     EYE SURGERY     strabismus as child    KNEE ARTHROPLASTY Right     Medical History: Past Medical History:  Diagnosis Date   Asthma    Heart murmur    Hyperlipidemia    Hypertension    Parkinson disease ([place])     Family History: Family History  Problem Relation Age of Onset   COPD Mother    Multiple sclerosis Son    Healthy Daughter    Healthy Daughter    Hematuria Neg Hx    Kidney cancer Neg Hx    Prostate cancer Neg Hx     Social History   Socioeconomic History   Marital status: Married    Spouse name: Not on file   Number of children: Not on file   Years of education: Not on file   Highest education level: Not on file  Occupational History   Not on file  Tobacco Use   Smoking status: Never   Smokeless tobacco: Never  Vaping Use   Vaping Use: Never used  Substance and Sexual Activity   Alcohol use: Yes    Alcohol/week: 0.0 standard drinks    Comment: 1 beer/month   Drug use: No   Sexual activity: Not on file  Other Topics Concern   Not on file  Social History Narrative   Not on file   Social Determinants of Health   Financial Resource Strain: Not on file  Food Insecurity: Not on file  Transportation Needs: Not on file  Physical Activity: Not on file  Stress: Not on file  Social Connections: Not on file  Intimate Partner Violence: Not on file      Review of Systems  Constitutional:  Negative for chills, fatigue and unexpected weight change.  HENT:  Positive for postnasal drip. Negative for congestion, rhinorrhea, sneezing and sore throat.   Eyes:  Negative for redness.  Respiratory:  Negative for cough, chest tightness and shortness of breath.   Cardiovascular:  Negative for chest pain and palpitations.  Gastrointestinal:  Negative for abdominal pain, constipation, diarrhea, nausea and vomiting.  Genitourinary:  Negative for dysuria and frequency.  Musculoskeletal:  Negative for arthralgias, back pain, joint swelling and neck pain.  Skin:  Negative for rash.  Neurological:  Negative.  Negative for tremors and numbness.  Hematological:  Negative for adenopathy. Does not bruise/bleed easily.  Psychiatric/Behavioral:  Negative for behavioral problems (Depression), sleep disturbance and suicidal ideas. The patient is not nervous/anxious.    Vital Signs: BP (!) 128/94   Pulse 78   Temp 98.2 F (36.8 C)   Resp 16   Ht 5\' 10"  (1.778 m)   Wt 222 lb 6.4 oz (100.9 kg)   SpO2 96%   BMI 31.91 kg/m    Physical Exam Constitutional:      Appearance: Normal appearance.  HENT:     Head: Normocephalic and atraumatic.     Nose: Nose normal.     Mouth/Throat:     Mouth: Mucous membranes are moist.     Pharynx: No posterior oropharyngeal erythema.  Eyes:     Extraocular Movements: Extraocular movements intact.     Pupils: Pupils are equal, round, and reactive to light.  Cardiovascular:     Pulses: Normal pulses.     Heart sounds: Normal heart sounds.  Pulmonary:     Effort: Pulmonary effort is normal.     Breath sounds: Normal breath sounds.  Neurological:     General: No focal deficit present.     Mental Status: He is alert.  Psychiatric:        Mood and Affect: Mood normal.        Behavior: Behavior normal.       Assessment/Plan: 1. Sleep disturbances Patient has sign and symptoms of OSA ( disturbed sleep, excessive fatigue during the day, uncontrolled bp and abnormal BMI). Baseline sleep study is ordered to further look into this. Long term complications of OSA was addressed with the patient.  - PSG Sleep Study; Future  2. Left ventricular hypokinesis Pt has low EF on recent echo, will get benefit from cardiology eval/cardiac cath etc   3. Other male erectile dysfunction - sildenafil (VIAGRA) 100 MG tablet; Take 0.5-1 tablets (50-100 mg total) by mouth daily as needed for erectile dysfunction.  Dispense: 5 tablet; Refill: 4  4. Benign hypertension Continue Norvasc as before, will combine losartan and hctz for better compliance  -  losartan-hydrochlorothiazide (HYZAAR) 50-12.5 MG tablet; Take 1 tablet by mouth daily.  Dispense: 90 tablet; Refill: 1   General Counseling: James Bush verbalizes understanding of the findings of todays visit and agrees with plan of treatment. I have discussed any further diagnostic evaluation that may be needed or ordered today. We also reviewed his medications today. he has been encouraged to call the office with any questions or concerns that should arise related to todays visit.    Orders Placed This Encounter  Procedures   PSG Sleep Study    Meds ordered this encounter  Medications  sildenafil (VIAGRA) 100 MG tablet    Sig: Take 0.5-1 tablets (50-100 mg total) by mouth daily as needed for erectile dysfunction.    Dispense:  5 tablet    Refill:  4   losartan-hydrochlorothiazide (HYZAAR) 50-12.5 MG tablet    Sig: Take 1 tablet by mouth daily.    Dispense:  90 tablet    Refill:  1    Total time spent:30 Minutes Time spent includes review of chart, medications, test results, and follow up plan with the patient.   [place] Controlled Substance Database was reviewed by me.   Dr Lavera Guise Internal medicine

## 2021-02-21 ENCOUNTER — Telehealth: Payer: Self-pay

## 2021-02-21 NOTE — Telephone Encounter (Signed)
S/w patient letting him know since it has been over 10 years since his last colonoscopy, we will discuss ordering another one during his next visit-Toni

## 2021-02-22 ENCOUNTER — Other Ambulatory Visit: Payer: Self-pay | Admitting: Internal Medicine

## 2021-02-22 DIAGNOSIS — I10 Essential (primary) hypertension: Secondary | ICD-10-CM

## 2021-03-05 ENCOUNTER — Telehealth: Payer: Self-pay

## 2021-03-05 NOTE — Telephone Encounter (Signed)
Left vm to screen for 03/06/21 appointment-Toni

## 2021-03-06 ENCOUNTER — Other Ambulatory Visit: Payer: Self-pay | Admitting: Nurse Practitioner

## 2021-03-06 ENCOUNTER — Encounter: Payer: Self-pay | Admitting: Nurse Practitioner

## 2021-03-06 ENCOUNTER — Other Ambulatory Visit: Payer: Self-pay

## 2021-03-06 ENCOUNTER — Ambulatory Visit (INDEPENDENT_AMBULATORY_CARE_PROVIDER_SITE_OTHER): Payer: No Typology Code available for payment source | Admitting: Nurse Practitioner

## 2021-03-06 VITALS — BP 141/80 | HR 82 | Temp 98.1°F | Resp 16 | Ht 70.0 in | Wt 222.2 lb

## 2021-03-06 DIAGNOSIS — I2510 Atherosclerotic heart disease of native coronary artery without angina pectoris: Secondary | ICD-10-CM

## 2021-03-06 DIAGNOSIS — Z6831 Body mass index (BMI) 31.0-31.9, adult: Secondary | ICD-10-CM

## 2021-03-06 DIAGNOSIS — R300 Dysuria: Secondary | ICD-10-CM

## 2021-03-06 DIAGNOSIS — I2583 Coronary atherosclerosis due to lipid rich plaque: Secondary | ICD-10-CM

## 2021-03-06 DIAGNOSIS — G479 Sleep disorder, unspecified: Secondary | ICD-10-CM

## 2021-03-06 DIAGNOSIS — Z1211 Encounter for screening for malignant neoplasm of colon: Secondary | ICD-10-CM

## 2021-03-06 DIAGNOSIS — N528 Other male erectile dysfunction: Secondary | ICD-10-CM

## 2021-03-06 DIAGNOSIS — I5189 Other ill-defined heart diseases: Secondary | ICD-10-CM

## 2021-03-06 DIAGNOSIS — G20 Parkinson's disease: Secondary | ICD-10-CM

## 2021-03-06 DIAGNOSIS — I10 Essential (primary) hypertension: Secondary | ICD-10-CM

## 2021-03-06 DIAGNOSIS — E782 Mixed hyperlipidemia: Secondary | ICD-10-CM

## 2021-03-06 DIAGNOSIS — G20A1 Parkinson's disease without dyskinesia, without mention of fluctuations: Secondary | ICD-10-CM

## 2021-03-06 DIAGNOSIS — R0989 Other specified symptoms and signs involving the circulatory and respiratory systems: Secondary | ICD-10-CM

## 2021-03-06 DIAGNOSIS — Z0001 Encounter for general adult medical examination with abnormal findings: Secondary | ICD-10-CM | POA: Diagnosis not present

## 2021-03-06 MED ORDER — AMLODIPINE BESYLATE 5 MG PO TABS: 5.0000 mg | ORAL_TABLET | Freq: Every day | ORAL | 1 refills | Status: DC

## 2021-03-06 NOTE — Progress Notes (Signed)
[facility] [place], [place] [address]  Internal MEDICINE  Office Visit Note  Patient Name: James Bush  [id number]  [id number]  Date of Service: 03/06/2021  Chief Complaint  Patient presents with   Annual Exam   Asthma   Hypertension   Heart Murmur   Quality Metric Gaps    Colonoscopy     HPI James Bush presents for an annual well visit and physical exam. he has a history of asthma, atrial fibrillation, hyperlipidemia, parkinson's disease, and hypertension. His last colonoscopy was in June 2012 so he is due now for colorectal cancer screening. His surgical history is significant for left eye surgery as a child, right knee arthoplasty and cardiac ablation for A-Fib. He lives at home with his wife and 1 adult daughter. He also has another daughter and 1 son who are grown and live elsewhere. He also has 8 grandchildren. He has received 2 doses of the COVID vaccin and 1 booster dose. He declined the shingles vaccine for now. He eats a regular diet and for physical activity, he walks around a lot at work and does yard work at home. He works at [employer] in [place], [place].   He denies any pain at this time.  Lab work will not be ordered, the patient will be having lab work down at work soon and will bring a copy of the results to his next office visit.  -His last echocardiogram results are from April 29, 2020. He had mild MR,and borderline LVEF of 50%. He is obese at 222 lbs today and BMI of 31.88 -blood pressure is acceptable today, currently taking amlodipine 5 mg daily and lisinopril-hydrochlorothiazide 20-12.5 mg 1 tablet daily.     Current Medication: Outpatient Encounter Medications as of 03/06/2021  Medication Sig   albuterol (VENTOLIN HFA) 108 (90 Base) MCG/ACT inhaler Inhale 2 puffs into the lungs every 6 (six) hours as needed for wheezing or shortness of breath.   amLODipine (NORVASC) 5 MG tablet Take 1 tablet (5 mg total) by mouth daily.   aspirin 81 MG  tablet Take 81 mg by mouth daily.   carbidopa-levodopa (SINEMET CR) 50-200 MG tablet Take 1 tablet by mouth at bedtime.   carbidopa-levodopa (SINEMET IR) 25-100 MG tablet 2 tablets at 5am/ 1 tablet at 9am/1pm/5pm.   losartan-hydrochlorothiazide (HYZAAR) 50-12.5 MG tablet Take 1 tablet by mouth daily.   Multiple Vitamin (MULTIVITAMIN WITH MINERALS) TABS tablet Take 1 tablet by mouth daily.   sildenafil (VIAGRA) 100 MG tablet Take 0.5-1 tablets (50-100 mg total) by mouth daily as needed for erectile dysfunction.   simvastatin (ZOCOR) 40 MG tablet TAKE 1 TABLET BY MOUTH EVERY DAY AT NIGHT   [DISCONTINUED] amLODipine (NORVASC) 5 MG tablet Take 1 tablet (5 mg total) by mouth daily.   [DISCONTINUED] hydrochlorothiazide (HYDRODIURIL) 25 MG tablet TAKE 1 TABLET BY MOUTH DAILY FOR BLOOD PRESSURE   No facility-administered encounter medications on file as of 03/06/2021.    Surgical History: Past Surgical History:  Procedure Laterality Date   CARDIAC ELECTROPHYSIOLOGY STUDY AND ABLATION     EYE SURGERY     strabismus as child   KNEE ARTHROPLASTY Right     Medical History: Past Medical History:  Diagnosis Date   Asthma    Heart murmur    Hyperlipidemia    Hypertension    Parkinson disease ([place])     Family History: Family History  Problem Relation Age of Onset   COPD Mother    Multiple sclerosis  Son    Healthy Daughter    Healthy Daughter    Hematuria Neg Hx    Kidney cancer Neg Hx    Prostate cancer Neg Hx     Social History   Socioeconomic History   Marital status: Married    Spouse name: Not on file   Number of children: Not on file   Years of education: Not on file   Highest education level: Not on file  Occupational History   Not on file  Tobacco Use   Smoking status: Never   Smokeless tobacco: Never  Vaping Use   Vaping Use: Never used  Substance and Sexual Activity   Alcohol use: Yes    Alcohol/week: 0.0 standard drinks    Comment: 1 beer/month   Drug use: No    Sexual activity: Not on file  Other Topics Concern   Not on file  Social History Narrative   Not on file   Social Determinants of Health   Financial Resource Strain: Not on file  Food Insecurity: Not on file  Transportation Needs: Not on file  Physical Activity: Not on file  Stress: Not on file  Social Connections: Not on file  Intimate Partner Violence: Not on file      Review of Systems  Constitutional:  Negative for activity change, appetite change, chills, fatigue, fever and unexpected weight change.  HENT: Negative.  Negative for congestion, ear pain, rhinorrhea, sore throat and trouble swallowing.   Eyes: Negative.   Respiratory:  Positive for shortness of breath (improving since BP meds were changed.). Negative for cough, chest tightness and wheezing.   Cardiovascular: Negative.  Negative for chest pain and palpitations.  Gastrointestinal: Negative.  Negative for abdominal pain, blood in stool, constipation, diarrhea, nausea and vomiting.  Endocrine: Negative.   Genitourinary: Negative.  Negative for difficulty urinating, dysuria, frequency, hematuria, penile discharge, penile pain, penile swelling, scrotal swelling, testicular pain and urgency.  Musculoskeletal: Negative.  Negative for arthralgias, back pain, joint swelling, myalgias and neck pain.  Skin: Negative.  Negative for rash and wound.  Allergic/Immunologic: Negative.  Negative for immunocompromised state.  Neurological:  Positive for tremors (has parkinson's disease). Negative for dizziness, seizures, numbness and headaches.  Hematological: Negative.   Psychiatric/Behavioral: Negative.  Negative for behavioral problems, self-injury and suicidal ideas. The patient is not nervous/anxious.    Vital Signs: BP (!) 141/80   Pulse 82   Temp 98.1 F (36.7 C)   Resp 16   Ht 5\' 10"  (1.778 m)   Wt 222 lb 3.2 oz (100.8 kg)   SpO2 94%   BMI 31.88 kg/m    Physical Exam Vitals reviewed.  Constitutional:       General: He is awake. He is not in acute distress.    Appearance: Normal appearance. He is well-developed and well-groomed. He is obese. He is not ill-appearing.  HENT:     Head: Normocephalic and atraumatic.     Right Ear: Tympanic membrane, ear canal and external ear normal.     Left Ear: Tympanic membrane, ear canal and external ear normal.     Nose: Nose normal. No congestion or rhinorrhea.     Mouth/Throat:     Mouth: Mucous membranes are moist.     Pharynx: Oropharynx is clear. No posterior oropharyngeal erythema.  Eyes:     General: Lids are normal. Vision grossly intact. Gaze aligned appropriately.     Extraocular Movements: Extraocular movements intact.     Conjunctiva/sclera: Conjunctivae normal.  Pupils: Pupils are equal, round, and reactive to light.     Funduscopic exam:    Right eye: Red reflex present.        Left eye: Red reflex present. Neck:     Thyroid: No thyroid mass, thyromegaly or thyroid tenderness.     Vascular: Normal carotid pulses. Carotid bruit (left carotid) present. No hepatojugular reflux or JVD.     Trachea: Trachea and phonation normal. No tracheal tenderness, tracheostomy or tracheal deviation.  Cardiovascular:     Rate and Rhythm: Normal rate and regular rhythm.     Chest Wall: PMI is not displaced. No thrill.     Pulses: Normal pulses.     Heart sounds: Normal heart sounds. No murmur heard.   No friction rub. No gallop.  Pulmonary:     Effort: Pulmonary effort is normal. No accessory muscle usage or respiratory distress.     Breath sounds: Normal breath sounds and air entry. No wheezing.  Abdominal:     General: Bowel sounds are normal. There is no distension.     Palpations: Abdomen is soft. There is no shifting dullness, fluid wave, hepatomegaly, splenomegaly or mass.     Tenderness: There is no abdominal tenderness. There is no guarding or rebound.     Hernia: No hernia is present.  Musculoskeletal:        General: Normal range of  motion.     Cervical back: Normal range of motion and neck supple. No edema or rigidity.     Right lower leg: No edema.     Left lower leg: No edema.  Lymphadenopathy:     Cervical: No cervical adenopathy.  Skin:    General: Skin is warm and dry.     Capillary Refill: Capillary refill takes less than 2 seconds.     Coloration: Skin is not jaundiced.     Findings: No bruising or rash.  Neurological:     Mental Status: He is alert and oriented to person, place, and time.     Motor: Tremor (mild, on medications.) present.  Psychiatric:        Mood and Affect: Mood normal.        Behavior: Behavior normal. Behavior is cooperative.        Thought Content: Thought content normal.        Judgment: Judgment normal.     Assessment/Plan: 1. Encounter for general adult medical examination with abnormal findings Age-appropriate preventive screenings discussed:screening colonoscopy. Annual physical exam completed. Routine labs for health maintenance will be done at his place of work and he will bring a copy to be scanned into his chart upon his next office visit. If any necessary labs were not done by his workplace, they will be ordered at a later date, patient is aware.  2. Left ventricular hypokinesis LVEF on most recent echocardiogram was borderline at 50%. He also has mild mitral regurgitation. He would benefit from seeing cardiology. Will repeat echocardiogram and then refer to cardiology after it is done.  - ECHOCARDIOGRAM COMPLETE; Future  3. Bruit of left carotid artery Bruit auscultated on exam today. Due to cardiac comorbidities including CAD, hyperlipidemia, and borderline LVEF, ultrasound of bilateral carotid arteries ordered to evaluate for carotid stenosis. - US Carotid Duplex Bilateral; Future  4. Essential hypertension Stable with current medications. Losartan and hydrochlorothiazide were changed into combination pill to promote adherence to medication regimen. He is also still  taking amlodipine 5 mg daily, refill ordered. Continue losartan-hydrochlorothiazide and amlodipine as  prescribed.  - amLODipine (NORVASC) 5 MG tablet; Take 1 tablet (5 mg total) by mouth daily.  Dispense: 90 tablet; Refill: 1  5. Coronary artery disease due to lipid rich plaque Tery takes simvastatin, no refill needed at this time.   6. Parkinson's disease ([place]) Followed by neurology, takes carbidopa-levodopa.   7. Mixed hyperlipidemia Stable, last lipid panel was normal except for an elevated LDL of 107. Continue medication as prescribed.   8. Other male erectile dysfunction Metoprolol stopped on 01/23/21 due to erectile dysfunction. Sildenafil prescribed at office visit on 02/20/21. Gevork reports that he has not tried the medication yet but he filled the prescription just in case.   9. Body mass index (BMI) of 31.0 to 31.9 in adult Trayvond is obese with an elevated BMI of 31.88. He described his diet as so-so. Discussed decreasing high carb and high sugar foods. Will discuss diet and lifestyle modifications further at next office visit.   10. Screening for colorectal cancer Due for screening colonoscopy, referral sent to [facility]. - Ambulatory referral to Gastroenterology  11. Sleep disturbances Sleep study was ordered at his previous office visit with Dr. Clayborn Bigness due to positive signs and symptoms of OSA per patient report. Once the sleep study is scheduled and done, will follow up in office to discuss the results.   12. Dysuria Routine urinalysis done.  - UA/M w/rflx Culture, Routine    General Counseling: Rossie verbalizes understanding of the findings of todays visit and agrees with plan of treatment. I have discussed any further diagnostic evaluation that may be needed or ordered today. We also reviewed his medications today. he has been encouraged to call the office with any questions or concerns that should arise related to todays visit.    Orders Placed This  Encounter  Procedures   US Carotid Duplex Bilateral   Ambulatory referral to Gastroenterology   ECHOCARDIOGRAM COMPLETE    Meds ordered this encounter  Medications   amLODipine (NORVASC) 5 MG tablet    Sig: Take 1 tablet (5 mg total) by mouth daily.    Dispense:  90 tablet    Refill:  1    Return in about 3 months (around 06/06/2021) for F/U, BP check, Kayleen Alig PCP.   Total time spent:30 Minutes Time spent includes review of chart, medications, test results, and follow up plan with the patient.   [place] Controlled Substance Database was reviewed by me.  This patient was seen by Jonetta Osgood, FNP-C in collaboration with Dr. Clayborn Bigness as a part of collaborative care agreement.  Armani Brar R. Valetta Fuller, MSN, FNP-C Internal medicine

## 2021-03-07 LAB — UA/M W/RFLX CULTURE, ROUTINE
Bilirubin, UA: NEGATIVE
Glucose, UA: NEGATIVE
Ketones, UA: NEGATIVE
Leukocytes,UA: NEGATIVE
Nitrite, UA: NEGATIVE
Protein,UA: NEGATIVE
RBC, UA: NEGATIVE
Specific Gravity, UA: 1.019 (ref 1.005–1.030)
Urobilinogen, Ur: 0.2 mg/dL (ref 0.2–1.0)
pH, UA: 7 (ref 5.0–7.5)

## 2021-03-07 LAB — MICROSCOPIC EXAMINATION
Bacteria, UA: NONE SEEN
Casts: NONE SEEN /lpf
Epithelial Cells (non renal): NONE SEEN /hpf (ref 0–10)
RBC, Urine: NONE SEEN /hpf (ref 0–2)
WBC, UA: NONE SEEN /hpf (ref 0–5)

## 2021-03-11 ENCOUNTER — Other Ambulatory Visit: Payer: Self-pay

## 2021-03-11 ENCOUNTER — Telehealth: Payer: Self-pay

## 2021-03-11 MED ORDER — CLENPIQ 10-3.5-12 MG-GM -GM/160ML PO SOLN: 320.0000 mL | Freq: Once | ORAL | 0 refills | Status: AC

## 2021-03-11 NOTE — Telephone Encounter (Signed)
Attempted to contact patient to schedule a PSG follow up to go over results. Had to leave a message with wife as he was unavailable.

## 2021-03-12 ENCOUNTER — Encounter (INDEPENDENT_AMBULATORY_CARE_PROVIDER_SITE_OTHER): Payer: No Typology Code available for payment source | Admitting: Internal Medicine

## 2021-03-12 DIAGNOSIS — G4733 Obstructive sleep apnea (adult) (pediatric): Secondary | ICD-10-CM | POA: Diagnosis not present

## 2021-03-12 DIAGNOSIS — G4719 Other hypersomnia: Secondary | ICD-10-CM

## 2021-03-14 DIAGNOSIS — G4733 Obstructive sleep apnea (adult) (pediatric): Secondary | ICD-10-CM | POA: Insufficient documentation

## 2021-03-14 DIAGNOSIS — G4719 Other hypersomnia: Secondary | ICD-10-CM | POA: Insufficient documentation

## 2021-03-14 NOTE — Procedures (Signed)
[place] Report Part I                                                                 Phone: [phone] Fax: [phone]  Patient Name: James Bush, James Bush Acquisition Number: [id number]  Date of Birth: 1960-09-20 Acquisition Date: 03/12/2021  Referring Physician: Jonetta Osgood, NP     History: The patient is a 60 year old male who was referred for evaluation of possible sleep apnea. Medical History: asthma, heart murmur, hyperlipidemia, hypertension,parkinson disease.  Medications: albuterol, amlodipine, aspirin, carbidopa-levodopa, losartan-hydrochlorothiazide, multiple vitamins, sildenafil, simvastatin.  Procedure: This routine overnight polysomnogram was performed on the [facility] 5 using the standard diagnostic protocol. This included 6 channels of EEG, 2 channels of EOG, chin EMG, bilateral anterior tibialis EMG, nasal/oral thermistor, PTAF (nasal pressure transducer), chest and abdominal wall movements, EKG, and pulse oximetry.  Description: The total recording time was 446.6 minutes. The total sleep time was 309.0 minutes. There were a total of 115.1 minutes of wakefulness after sleep onset for a reducedsleep efficiency of 69.2%. The latency to sleep onset was within normal limits at 22.5 minutes. The R sleep onset latency was prolonged at 256.5 minutes. Sleep parameters, as a percentage of the total sleep time, demonstrated 4.2% of sleep was in N1 sleep, 82.4% N2, 0.0% N3 and 13.4% R sleep. There were a total of 26 arousals for an arousal index of 5.0 arousals per hour of sleep that was normal.  Respiratory monitoring demonstrated occasional mild degree of snoring in all positions. There were 52 apneas and hypopneas for an Apnea Hypopnea Index of 10.1 apneas and hypopneas per hour of sleep. The REM related apnea hypopnea index was 24.6/hr of REM sleep compared to a NREM AHI of 7.9/hr. The Respiratory Disturbance Index, which includes 1 respiratory effort related  arousals (RERAs), was 10.3 respiratory events per hour of sleep.  The average duration of the respiratory events was 23.6 seconds with a maximum duration of 42.5 seconds. The respiratory events occurred in all positions. The respiratory events were associated with peripheral oxygen desaturations on the average to 93%. The lowest oxygen desaturation associated with a respiratory event was 89%. Additionally, the baseline oxygen saturation during wakefulness was 96%, during NREM sleep averaged 96%, and during REM sleep averaged  95%. The total duration of oxygen < 90% was 0.0 minutes.  Cardiac monitoring- did not demonstrate transient cardiac decelerations associated with the apneas. There were no significant cardiac rhythm irregularities.   Periodic limb movement monitoring- demonstrated that there were 36 periodic limb movements for a periodic limb movement index of 7.0 periodic limb movements per hour of sleep.   Impression: This routine overnight polysomnogram demonstrated significant obstructive sleep apnea with an overall Apnea Hypopnea Index of 10.1 apneas and hypopneas per hour of sleep,  which increased to 24.6 in REM sleep. As REM percentage was reduced, the findings likely underestimate the severity of the sleep apnea.  There were few periodic limb movements that commonly are not significant. Clinical correlation would be suggested.   There was a reduced sleep efficiency with a reduced REM percentage and no slow wave sleep.  These findings would appear to be due to obstructive sleep apnea.  Recommendations:    A CPAP titration would  be recommended due to the severity of the sleep apnea. Additionally, would recommend weight loss in a patient with a BMI of 30.1.     Allyne Gee, MD, [facility] Diplomate ABMS-Pulmonary, Critical Care and Sleep Medicine  Electronically reviewed and digitally signed      [place] Report Part II  Phone: [phone] Fax:  [phone]  Patient last name Bush Neck Size 16.5 in. Acquisition [phone]  Patient first name James Weight 210.0 lbs. Started 03/12/2021 at 9:29:26 PM  Birth date 1960/09/13 Height 70.0 in. Stopped 03/13/2021 at 5:00:32 AM  Age 71 BMI 30.1 lb/in2 Duration 446.6  Study Type Adult      Report generated by: Adriana Mccallum, RPSGT Sleep Data: Lights Out: 9:33:26 PM Sleep Onset: 9:55:56 PM  Lights On: 5:00:02 AM Sleep Efficiency: 69.2 %  Total Recording Time: 446.6 min Sleep Latency (from Lights Off) 22.5 min  Total Sleep Time (TST): 309.0 min R Latency (from Sleep Onset): 256.5 min  Sleep Period Time: 419.5 min Total number of awakenings: 15  Wake during sleep: 110.5 min Wake After Sleep Onset (WASO): 115.1 min   Sleep Data:         Arousal Summary: Stage  Latency from lights out (min) Latency from sleep onset (min) Duration (min) % Total Sleep Time  Normal values  N 1 22.5 0.0 13.0 4.2 (5%)  N 2 23.0 0.5 254.5 82.4 (50%)  N 3       0.0 0.0 (20%)  R 279.0 256.5 41.5 13.4 (25%)    Number Index  Spontaneous 24 4.7  Apneas & Hypopneas 3 0.6  RERAs 1 0.2       (Apneas & Hypopneas & RERAs)  (4) (0.8)  Limb Movement 7 1.4  Snore 0 0.0  TOTAL 35 6.8     Respiratory Data:  CA OA MA Apnea Hypopnea* A+ H RERA Total  Number 0 2 0 2 50 52 1 53  Mean Dur (sec) 0.0 14.3 0.0 14.3 24.1 23.8 13.5 23.6  Max Dur (sec) 0.0 16.0 0.0 16.0 42.5 42.5 13.5 42.5  Total Dur (min) 0.0 0.5 0.0 0.5 20.1 20.6 0.2 20.8  % of TST 0.0 0.2 0.0 0.2 6.5 6.7 0.1 6.7  Index (#/h TST) 0.0 0.4 0.0 0.4 9.7 10.1 0.2 10.3  *Hypopneas scored based on 4% or greater desaturation.  Sleep Stage:        REM NREM TST  AHI 24.6 7.9 10.1  RDI 24.6 8.1 10.3           Body Position Data:  Sleep (min) TST (%) REM (min) NREM (min) CA (#) OA (#) MA (#) HYP (#) AHI (#/h) RERA (#) RDI (#/h) Desat (#)  Supine 154.0 49.84 0.0 154.0 0 2 0 22 9.4 1 9.7 27  Non-Supine 155.00 50.16 41.50 113.50 0.00 0.00 0.00 28.00  10.84 0 10.84 34.00  Left: 16.0 5.18 0.0 16.0 0 0 0 0 0.0 0 0.00 0  Right: 139.0 44.98 41.5 97.5 0 0 0 28 12.1 0 12.1 34     Snoring: Total number of snoring episodes  0  Total time with snoring    min (   % of sleep)   Oximetry Distribution:             WK REM NREM TOTAL  Average (%)   96 95 96 96  < 90% 0.0 0.0 0.0 0.0  < 80% 0.0 0.0 0.0 0.0  < 70% 0.0 0.0 0.0 0.0  #  of Desaturations* 1 19 41 61  Desat Index (#/hour) 0.4 27.5 9.2 11.8  Desat Max (%) 4 7 7 7   Desat Max Dur (sec) 56.0 44.0 116.0 116.0  Approx Min O2 during sleep 89  Approx min O2 during a respiratory event 89  Was Oxygen added (Y/N) and final rate No:   0 LPM  *Desaturations based on 3% or greater drop from baseline.   Cheyne Stokes Breathing: None Present   Heart Rate Summary:  Average Heart Rate During Sleep 64.6 bpm      Highest Heart Rate During Sleep (95th %) 74.0 bpm      Highest Heart Rate During Sleep 198 bpm (artifact)  Highest Heart Rate During Recording (TIB) 198 bpm (artifact)   Heart Rate Observations: Event Type # Events   Bradycardia 0 Lowest HR Scored: N/A  Sinus Tachycardia During Sleep 0 Highest HR Scored: N/A  Narrow Complex Tachycardia 0 Highest HR Scored: N/A  Wide Complex Tachycardia 0 Highest HR Scored: N/A  Asystole 0 Longest Pause: N/A  Atrial Fibrillation 0 Duration Longest Event: N/A  Other Arrythmias  No Type:    Periodic Limb Movement Data: (Primary legs unless otherwise noted) Total # Limb Movement 53 Limb Movement Index 10.3  Total # PLMS 36 PLMS Index 7.0  Total # PLMS Arousals 4 PLMS Arousal Index 0.8  Percentage Sleep Time with PLMS 18.87min (6.1 % sleep)  Mean Duration limb movements (secs) 281.3

## 2021-03-19 ENCOUNTER — Encounter: Payer: Self-pay | Admitting: Gastroenterology

## 2021-03-20 ENCOUNTER — Ambulatory Visit: Payer: No Typology Code available for payment source | Admitting: Anesthesiology

## 2021-03-20 ENCOUNTER — Encounter: Payer: Self-pay | Admitting: Gastroenterology

## 2021-03-20 ENCOUNTER — Ambulatory Visit
Admission: RE | Admit: 2021-03-20 | Discharge: 2021-03-20 | Disposition: A | Discharge status: Home / Self Care | Payer: No Typology Code available for payment source | Attending: Gastroenterology | Admitting: Gastroenterology

## 2021-03-20 ENCOUNTER — Other Ambulatory Visit: Payer: Self-pay

## 2021-03-20 ENCOUNTER — Encounter
Admission: RE | Disposition: A | Discharge status: Home / Self Care | Payer: Self-pay | Source: Home / Self Care | Attending: Gastroenterology

## 2021-03-20 DIAGNOSIS — E785 Hyperlipidemia, unspecified: Secondary | ICD-10-CM | POA: Insufficient documentation

## 2021-03-20 DIAGNOSIS — Z1211 Encounter for screening for malignant neoplasm of colon: Secondary | ICD-10-CM | POA: Diagnosis present

## 2021-03-20 DIAGNOSIS — Z7982 Long term (current) use of aspirin: Secondary | ICD-10-CM | POA: Insufficient documentation

## 2021-03-20 DIAGNOSIS — G20 Parkinson's disease: Secondary | ICD-10-CM | POA: Insufficient documentation

## 2021-03-20 DIAGNOSIS — Z79899 Other long term (current) drug therapy: Secondary | ICD-10-CM | POA: Diagnosis not present

## 2021-03-20 DIAGNOSIS — I10 Essential (primary) hypertension: Secondary | ICD-10-CM | POA: Diagnosis not present

## 2021-03-20 HISTORY — PX: COLONOSCOPY: SHX5424

## 2021-03-20 SURGERY — COLONOSCOPY
Anesthesia: General

## 2021-03-20 MED ORDER — PROPOFOL 500 MG/50ML IV EMUL
INTRAVENOUS | Status: DC | PRN
  Administered 2021-03-20: 175 ug/kg/min via INTRAVENOUS

## 2021-03-20 MED ORDER — SODIUM CHLORIDE 0.9 % IV SOLN: INTRAVENOUS | Status: DC

## 2021-03-20 MED ORDER — LIDOCAINE HCL (PF) 2 % IJ SOLN
INTRAMUSCULAR | Status: AC
  Filled 2021-03-20: qty 5

## 2021-03-20 MED ORDER — PROPOFOL 10 MG/ML IV BOLUS
INTRAVENOUS | Status: DC | PRN
  Administered 2021-03-20: 80 mg via INTRAVENOUS

## 2021-03-20 MED ORDER — PROPOFOL 500 MG/50ML IV EMUL
INTRAVENOUS | Status: AC
  Filled 2021-03-20: qty 50

## 2021-03-20 MED ORDER — LIDOCAINE HCL (CARDIAC) PF 100 MG/5ML IV SOSY
PREFILLED_SYRINGE | INTRAVENOUS | Status: DC | PRN
  Administered 2021-03-20: 50 mg via INTRAVENOUS

## 2021-03-20 NOTE — Anesthesia Procedure Notes (Signed)
Date/Time: 03/20/2021 9:27 AM Performed by: Johnna Acosta, CRNA Pre-anesthesia Checklist: Patient identified, Emergency Drugs available, Suction available, Patient being monitored and Timeout performed Patient Re-evaluated:Patient Re-evaluated prior to induction Oxygen Delivery Method: Nasal cannula Preoxygenation: Pre-oxygenation with 100% oxygen Induction Type: IV induction

## 2021-03-20 NOTE — Op Note (Signed)
[facility] Gastroenterology Patient Name: James Bush Procedure Date: 03/20/2021 9:19 AM MRN: [id number] Account #: [id number] Date of Birth: 09-May-1961 Admit Type: Outpatient Age: 60 Room: [facility] ENDO ROOM 3 Gender: Male Note Status: Finalized Procedure:             Colonoscopy Indications:           Screening for colorectal malignant neoplasm Providers:             Jonathon Bellows MD, MD Referring MD:          Lavera Guise, MD (Referring MD) Medicines:             Monitored Anesthesia Care Complications:         No immediate complications. Procedure:             Pre-Anesthesia Assessment:                        - Prior to the procedure, a History and Physical was                         performed, and patient medications, allergies and                         sensitivities were reviewed. The patient's tolerance                         of previous anesthesia was reviewed.                        - The risks and benefits of the procedure and the                         sedation options and risks were discussed with the                         patient. All questions were answered and informed                         consent was obtained.                        - ASA Grade Assessment: II - A patient with mild                         systemic disease.                        After obtaining informed consent, the colonoscope was                         passed under direct vision. Throughout the procedure,                         the patient's blood pressure, pulse, and oxygen                         saturations were monitored continuously. The                         Colonoscope was introduced through the anus  and                         advanced to the the cecum, identified by the                         appendiceal orifice. The colonoscopy was performed                         with ease. The patient tolerated the procedure well.                         The quality of the  bowel preparation was excellent. Findings:      The perianal and digital rectal examinations were normal.      The entire examined colon appeared normal on direct and retroflexion       views. Impression:            - The entire examined colon is normal on direct and                         retroflexion views.                        - No specimens collected. Recommendation:        - Discharge patient to home (with escort).                        - Resume previous diet.                        - Continue present medications.                        - Repeat colonoscopy in 10 years for screening                         purposes. Procedure Code(s):     --- Professional ---                        [phone], Colonoscopy, flexible; diagnostic, including                         collection of specimen(s) by brushing or washing, when                         performed (separate procedure) Diagnosis Code(s):     --- Professional ---                        Z12.11, Encounter for screening for malignant neoplasm                         of colon CPT copyright 2019 [employer]. All rights reserved. The codes documented in this report are preliminary and upon coder review may  be revised to meet current compliance requirements. Jonathon Bellows, MD Jonathon Bellows MD, MD 03/20/2021 9:48:14 AM This report has been signed electronically. Number of Addenda: 0 Note Initiated On: 03/20/2021 9:19 AM Scope Withdrawal Time: 0 hours 10 minutes 39 seconds  Total Procedure Duration: 0 hours 15 minutes 41 seconds  Estimated Blood Loss:  Estimated blood loss: none.      [facility]

## 2021-03-20 NOTE — Anesthesia Postprocedure Evaluation (Signed)
Anesthesia Post Note  Patient: James Bush  Procedure(s) Performed: COLONOSCOPY  Patient location during evaluation: Phase II Anesthesia Type: General Level of consciousness: awake and alert, awake and oriented Pain management: pain level controlled Vital Signs Assessment: post-procedure vital signs reviewed and stable Respiratory status: spontaneous breathing, nonlabored ventilation and respiratory function stable Cardiovascular status: blood pressure returned to baseline and stable Postop Assessment: no apparent nausea or vomiting Anesthetic complications: no   No notable events documented.   Last Vitals:  Vitals:   03/20/21 1011 03/20/21 1021  BP:  (!) 145/96  Pulse:    Resp: 19 (!) 27  Temp:    SpO2:      Last Pain:  Vitals:   03/20/21 1021  TempSrc:   PainSc: 0-No pain                 Phill Mutter

## 2021-03-20 NOTE — H&P (Signed)
James Bellows, MD [address] [place], [place], [place], [address] [id number], [place], [place], [place], [address] Phone: [phone]  Fax: [phone]  Primary Care Physician:  Lavera Guise, MD   Pre-Procedure History & Physical: HPI:  James Bush is a 60 y.o. male is here for an colonoscopy.   Past Medical History:  Diagnosis Date   Asthma    Heart murmur    Hyperlipidemia    Hypertension    Parkinson disease ([place])     Past Surgical History:  Procedure Laterality Date   CARDIAC ELECTROPHYSIOLOGY STUDY AND ABLATION     EYE SURGERY     strabismus as child   KNEE ARTHROPLASTY Right     Prior to Admission medications   Medication Sig Start Date End Date Taking? Authorizing Provider  albuterol (VENTOLIN HFA) 108 (90 Base) MCG/ACT inhaler Inhale 2 puffs into the lungs every 6 (six) hours as needed for wheezing or shortness of breath. 07/31/20  Yes Boscia, Heather E, NP  amLODipine (NORVASC) 5 MG tablet Take 1 tablet (5 mg total) by mouth daily. 03/06/21  Yes Abernathy, Yetta Flock, NP  aspirin 81 MG tablet Take 81 mg by mouth daily.   Yes [provider]  carbidopa-levodopa (SINEMET CR) 50-200 MG tablet Take 1 tablet by mouth at bedtime. 11/21/20  Yes Tat, Eustace Quail, DO  carbidopa-levodopa (SINEMET IR) 25-100 MG tablet 2 tablets at 5am/ 1 tablet at 9am/1pm/5pm. 11/21/20  Yes Tat, Eustace Quail, DO  losartan-hydrochlorothiazide (HYZAAR) 50-12.5 MG tablet Take 1 tablet by mouth daily. 02/20/21  Yes Lavera Guise, MD  Multiple Vitamin (MULTIVITAMIN WITH MINERALS) TABS tablet Take 1 tablet by mouth daily.   Yes [provider]  sildenafil (VIAGRA) 100 MG tablet Take 0.5-1 tablets (50-100 mg total) by mouth daily as needed for erectile dysfunction. 02/20/21  Yes Lavera Guise, MD  simvastatin (ZOCOR) 40 MG tablet TAKE 1 TABLET BY MOUTH EVERY DAY AT NIGHT 01/26/21  Yes Jonetta Osgood, NP    Allergies as of 03/11/2021   (No Known Allergies)    Family History   Problem Relation Age of Onset   COPD Mother    Multiple sclerosis Son    Healthy Daughter    Healthy Daughter    Hematuria Neg Hx    Kidney cancer Neg Hx    Prostate cancer Neg Hx     Social History   Socioeconomic History   Marital status: Married    Spouse name: Not on file   Number of children: Not on file   Years of education: Not on file   Highest education level: Not on file  Occupational History   Not on file  Tobacco Use   Smoking status: Never   Smokeless tobacco: Never  Vaping Use   Vaping Use: Never used  Substance and Sexual Activity   Alcohol use: Yes    Alcohol/week: 0.0 standard drinks    Comment: 1 beer/month   Drug use: No   Sexual activity: Not on file  Other Topics Concern   Not on file  Social History Narrative   Not on file   Social Determinants of Health   Financial Resource Strain: Not on file  Food Insecurity: Not on file  Transportation Needs: Not on file  Physical Activity: Not on file  Stress: Not on file  Social Connections: Not on file  Intimate Partner Violence: Not on file    Review of Systems: See HPI, otherwise negative ROS  Physical Exam: BP (!) 142/93   Pulse 74   Temp (!) 96.6 F (35.9 C) (Temporal)   Resp 20   Ht 5\' 10"  (1.778 m)   Wt 97.5 kg   SpO2 100%   BMI 30.85 kg/m  General:   Alert,  pleasant and cooperative in NAD Head:  Normocephalic and atraumatic. Neck:  Supple; no masses or thyromegaly. Lungs:  Clear throughout to auscultation, normal respiratory effort.    Heart:  +S1, +S2, Regular rate and rhythm, No edema. Abdomen:  Soft, nontender and nondistended. Normal bowel sounds, without guarding, and without rebound.   Neurologic:  Alert and  oriented x4;  grossly normal neurologically.  Impression/Plan: [name] is here for an colonoscopy to be performed for Screening colonoscopy average risk   Risks, benefits, limitations, and alternatives regarding  colonoscopy have been reviewed with the  patient.  Questions have been answered.  All parties agreeable.   James Bellows, MD  03/20/2021, 9:13 AM

## 2021-03-20 NOTE — Transfer of Care (Signed)
Immediate Anesthesia Transfer of Care Note  Patient: James Bush  Procedure(s) Performed: COLONOSCOPY  Patient Location: PACU  Anesthesia Type:General  Level of Consciousness: drowsy  Airway & Oxygen Therapy: Patient Spontanous Breathing  Post-op Assessment: Report given to RN and Post -op Vital signs reviewed and stable  Post vital signs: Reviewed and stable  Last Vitals:  Vitals Value Taken Time  BP 112/85 03/20/21 0955  Temp 36.1 C 03/20/21 0951  Pulse 66 03/20/21 0955  Resp 25 03/20/21 0955  SpO2 99 % 03/20/21 0955  Vitals shown include unvalidated device data.  Last Pain:  Vitals:   03/20/21 0951  TempSrc: Temporal  PainSc: Asleep         Complications: No notable events documented.

## 2021-03-20 NOTE — Anesthesia Preprocedure Evaluation (Signed)
Anesthesia Evaluation  Patient identified by MRN, date of birth, ID band Patient awake    Reviewed: Allergy & Precautions, NPO status , Patient's Chart, lab work & pertinent test results  Airway Mallampati: III  TM Distance: >3 FB Neck ROM: Full    Dental no notable dental hx.    Pulmonary asthma , sleep apnea ,    Pulmonary exam normal        Cardiovascular hypertension, Pt. on medications + CAD  Normal cardiovascular exam+ Valvular Problems/Murmurs      Neuro/Psych Parkinsons negative neurological ROS  negative psych ROS   GI/Hepatic negative GI ROS, Neg liver ROS,   Endo/Other  negative endocrine ROS  Renal/GU negative Renal ROS  negative genitourinary   Musculoskeletal negative musculoskeletal ROS (+)   Abdominal   Peds negative pediatric ROS (+)  Hematology negative hematology ROS (+)   Anesthesia Other Findings Asthma    Heart murmur    Hyperlipidemia    Hypertension    Parkinson disease (HCC)  Afib s/p ablation EF >50%      Reproductive/Obstetrics negative OB ROS                             Anesthesia Physical Anesthesia Plan  ASA: 3  Anesthesia Plan: General   Post-op Pain Management:    Induction: Intravenous  PONV Risk Score and Plan: 2 and Propofol infusion and TIVA  Airway Management Planned: Natural Airway and Nasal Cannula  Additional Equipment:   Intra-op Plan:   Post-operative Plan:   Informed Consent: I have reviewed the patients History and Physical, chart, labs and discussed the procedure including the risks, benefits and alternatives for the proposed anesthesia with the patient or authorized representative who has indicated his/her understanding and acceptance.       Plan Discussed with: CRNA, Anesthesiologist and Surgeon  Anesthesia Plan Comments:         Anesthesia Quick Evaluation

## 2021-03-21 ENCOUNTER — Encounter: Payer: Self-pay | Admitting: Gastroenterology

## 2021-03-23 ENCOUNTER — Other Ambulatory Visit: Payer: Self-pay | Admitting: Nurse Practitioner

## 2021-03-23 DIAGNOSIS — I10 Essential (primary) hypertension: Secondary | ICD-10-CM

## 2021-03-26 ENCOUNTER — Encounter (INDEPENDENT_AMBULATORY_CARE_PROVIDER_SITE_OTHER): Payer: No Typology Code available for payment source | Admitting: Internal Medicine

## 2021-03-26 DIAGNOSIS — G4733 Obstructive sleep apnea (adult) (pediatric): Secondary | ICD-10-CM | POA: Diagnosis not present

## 2021-03-31 NOTE — Procedures (Signed)
[facility] Report Part I  Phone: [phone] Fax: [phone]  Patient Name: James, Bush Acquisition Number: [id number]  Date of Birth: July 20, 1961 Acquisition Date: 03/26/2021  Referring Physician: Jonetta Osgood, NP     History: The patient is a 60 year old  male. Medical History: asthma, heart murmur, hyperlipidemia, hypertension, parkinson,   Medications: albuterol, amlodipine, aspirin, carbidopa-levodopa, losartan-hydrochlorothiazide, multiple vitamins, sildenafil, simvastatin  Procedure: This routine overnight polysomnogram was performed on the [place] 4 or 5 using the standard CPAP/BIPAP protocol. This included 6 channels of EEG, 2 channels of EOG, chin EMG, bilateral anterior tibialis EMG, nasal/oral thermister, PTAF (nasal pressure transducer), chest and abdominal wall movements, EKG, and pulse oximetry.  Description: The total recording time was 422.4 minutes. The total sleep time was 405.0 minutes. There were a total of 16.6 minutes of wakefulness after sleep onset for a very goodsleep efficiency of 95.9%. The latency to sleep onset was short at 0.8 minutes. The R sleep onset latency was within normal limits at 93.5 minutes. Sleep parameters, as a percentage of the total sleep time, demonstrated 15.4% of sleep was in N1 sleep, 69.1% N2, 5.1% N3 and 10.4% R sleep. There were a total of 39 arousals for an arousal index of 5.8 arousals per hour of sleep that was normal.  Overall, there were a total of 36 respiratory events for a respiratory disturbance index, which includes apneas, hypopneas and RERAs (increased respiratory effort) of 5.3 respiratory events per hour of sleep during the pressure titration. CPAP was initiated at5  cm of H2O at lights out, 9:49:35 PM. It was titrated in 1-2 cm increments to the final pressure of  cm 10 of H2O.   Additionally, the baseline oxygen saturation during wakefulness was 96%, during NREM sleep averaged 95%, and during  REM sleep averaged 95%. The total duration of oxygen < 90% was 0.0 minutes.  Cardiac monitoring- did not demonstrate transient cardiac decelerations associated with the apneas. There were no significant cardiac rhythm irregularities.   Periodic limb movement monitoring- did not demonstrate periodic limb movements.   Impression: This patient's obstructive sleep apnea demonstrated significant improvement with the utilization of nasal CPAP.      Recommendation:  CPAP titration study is adequate to control sleep apnea at a pressure of 10cm H2O Nasal Decongestants and antihistamines may be of help for increased upper airway resistance. Weight loss through dietary and lifestyle modifications to include exercise is recommended in the presence of obesity. Alternative treatment options if the patient is not willing or is unable to use CPAP include oral appliances as well as surgical intervention in the right clinical setting. Clinical correlation is recommended. Please feel free to call the office for any further questions or assistance in the care of this patient.    Allyne Gee, MD [facility] Diplomate ABMS Pulmonary Critical Care Medicine Sleep Medicine Electronically reviewed and digitally signed      [facility] CPAP/BIPAP Polysomnogram Report Part II Phone: [phone] Fax: [phone]  Patient last name [name] Neck Size    in. Acquisition [id number]  Patient first name James Bush Weight 210.0 lbs. Started 03/26/2021 at 9:42:53 PM  Birth date 09/16/1960 Height 70.0 in. Stopped 03/27/2021 at 4:54:47 AM  Age 78      Type Adult BMI 30.1 lb/in2 Duration 422.4  Sydnee Cabal, RPSGT Sleep Data: Lights Out: 9:49:35 PM Sleep Onset: 9:50:23 PM  Lights On: 4:51:59 AM Sleep Efficiency: 95.9 %  Total Recording Time:  422.4 min Sleep Latency (from Lights Off) 0.8 min  Total Sleep Time (TST): 405.0 min R Latency (from Sleep Onset): 93.5 min  Sleep Period Time: 420.5 min Total number of  awakenings: 7  Wake during sleep: 15.5 min Wake After Sleep Onset (WASO): 16.6 min   Sleep Data:         Arousal Summary: Stage  Latency from lights out (min) Latency from sleep onset (min) Duration (min) % Total Sleep Time  Normal values  N 1 0.8 0.0 62.5 15.4 (5%)  N 2 11.8 11.0 280.0 69.1 (50%)  N 3 68.8 68.0 20.5 5.1 (20%)  R 94.3 93.5 42.0 10.4 (25%)    Number Index  Spontaneous 24 3.6  Apneas & Hypopneas 10 1.5  RERAs 0 0.0       (Apneas & Hypopneas & RERAs)  (10) (1.5)  Limb Movement 6 0.9  Snore 0 0.0  TOTAL 40 5.9     Respiratory Data:  CA OA MA Apnea Hypopnea* A+ H RERA Total  Number 2 1 0 3 33 36 0 36  Mean Dur (sec) 11.8 13.0 0.0 12.2 19.4 18.8 0.0 18.8  Max Dur (sec) 13.0 13.0 0.0 13.0 33.0 33.0 0.0 33.0  Total Dur (min) 0.4 0.2 0.0 0.6 10.7 11.3 0.0 11.3  % of TST 0.1 0.1 0.0 0.2 2.6 2.8 0.0 2.8  Index (#/h TST) 0.3 0.1 0.0 0.4 4.9 5.3 0.0 5.3  *Hypopneas scored based on 4% or greater desaturation.  Sleep Stage:         Body Position Data:    REM NREM TST  AHI 17.1 4.0 5.3  RDI 17.1 4.0 5.3    Sleep (min) TST (%) REM (min) NREM (min) CA (#) OA (#) MA (#) HYP (#) AHI (#/h) RERA (#) RDI (#/h) Desat (#)  Supine 341.2 84.25 33.0 308.'2 2 1 '$ 0 29 5.6 0 5.6 117  Non-Supine 63.80 15.75 9.00 54.80 0.00 0.00 0.00 4.00 3.76 0 3.76 13.00  Left: 63.8 15.75 9.0 54.8 0 0 0 4 3.8 0 3.8 13       Snoring: Total number of snoring episodes  0  Total time with snoring    min (   % of sleep)   Oximetry Distribution:             WK REM NREM TOTAL  Average (%)   96 95 95 95  < 90% 0.0 0.0 0.0 0.0  < 80% 0.0 0.0 0.0 0.0  < 70% 0.0 0.0 0.0 0.0  # of Desaturations* 0 23 107 130  Desat Index (#/hour) 0.0 32.9 17.7 19.3  Desat Max (%) 0 '6 6 6  '$ Desat Max Dur (sec) 0.0 67.0 110.0 110.0  Approx Min O2 during sleep 90  Approx min O2 during a respiratory event 90  Was Oxygen added (Y/N) and final rate No:   0 LPM  *Desaturations based on 3% or greater drop  from baseline.   Cheyne Stokes Breathing: None Present    Heart Rate Summary:  Average Heart Rate During Sleep 62.9 bpm      Highest Heart Rate During Sleep (95th %) 70.0 bpm      Highest Heart Rate During Sleep 128 bpm      Highest Heart Rate During Recording (TIB) 131 bpm       Heart Rate Observations: Event Type # Events   Bradycardia 0 Lowest HR Scored: N/A  Sinus Tachycardia During Sleep 0 Highest HR Scored: N/A  Narrow Complex  Tachycardia 0 Highest HR Scored: N/A  Wide Complex Tachycardia 0 Highest HR Scored: N/A  Asystole 0 Longest Pause: N/A  Atrial Fibrillation 0 Duration Longest Event: N/A  Other Arrythmias  No Type:   Periodic Limb Movement Data: (Primary legs unless otherwise noted) Total # Limb Movement 8 Limb Movement Index 1.2  Total # PLMS    PLMS Index     Total # PLMS Arousals    PLMS Arousal Index     Percentage Sleep Time with PLMS   min (   % sleep)  Mean Duration limb movements (secs)      Brief Summary:     Starting pressure:  cm of H2O Maximum Pressure:  cm of H2O  Mask Type: F&P Eson Nasal  Mask Size: wide  Humidifier used: yes Chin Strap used: no  CFlex: 1 BiFlex: no    IPAP Level (cmH2O) EPAP Level (cmH2O) Total Duration (min) Sleep Duration (min) Sleep (%) REM (%) CA  #) OA # MA # HYP #) AHI (#/hr) RERAs # RERAs (#/hr) RDI (#/hr)  5 5 14.0 13.5 96.4 0.0 0 0 0 2 8.9 0 0.0 8.'9  7 7 '$ 92.6 92.1 99.5 3.8 0 0 0 5 3.3 0 0.0 3.'3  9 9 '$ 176.8 170.3 96.3 9.3 2 0 0 14 5.6 0 0.0 5.'6  10 10 '$ 136.2 128.2 94.1 16.2 0 1 0 12 6.1 0 0.0 6.1

## 2021-04-03 ENCOUNTER — Other Ambulatory Visit: Payer: Self-pay

## 2021-04-03 ENCOUNTER — Encounter: Payer: Self-pay | Admitting: Nurse Practitioner

## 2021-04-03 ENCOUNTER — Ambulatory Visit: Payer: No Typology Code available for payment source | Admitting: Nurse Practitioner

## 2021-04-03 VITALS — BP 138/88 | HR 82 | Temp 98.4°F | Resp 16 | Ht 70.0 in | Wt 224.0 lb

## 2021-04-03 DIAGNOSIS — I10 Essential (primary) hypertension: Secondary | ICD-10-CM | POA: Diagnosis not present

## 2021-04-03 DIAGNOSIS — G4733 Obstructive sleep apnea (adult) (pediatric): Secondary | ICD-10-CM | POA: Diagnosis not present

## 2021-04-03 DIAGNOSIS — E782 Mixed hyperlipidemia: Secondary | ICD-10-CM

## 2021-04-03 NOTE — Progress Notes (Signed)
[facility] [place], [place] [address]  Internal MEDICINE  Office Visit Note  Patient Name: James Bush  [id number]  [id number]  Date of Service: 04/03/2021  Chief Complaint  Patient presents with   Follow-up    SS results, care consideration, Avoid simvastatin greater than 20 mg  w/ certain drugs, rt leg pain that moves to hip while laying in bed, patient normally has to stand up to relieve the pain    Hypertension   Asthma   Heart Murmur    HPI James Bush presents for a follow-up visit to discuss sleep study results. Reports having right leg pain , moves to hip while laying in bed, patient normally has to stand up to relieve the pain. He is scheduled to have his echocardiogram done on 04/23/21 and his carotid ultrasound on 04/09/21.   Sleep study results: CPAP titration study is adequate to control sleep apnea at a pressure of 10cm H2O Nasal Decongestants and antihistamines may be of help for increased upper airway resistance. Weight loss through dietary and lifestyle modifications to include exercise is recommended in the presence of obesity. Alternative treatment options if the patient is not willing or is unable to use CPAP include oral appliances as well as surgical intervention in the right clinical setting. Clinical correlation is recommended. Please feel free to call the office for any further questions or assistance in the care of this patient.      Current Medication: Outpatient Encounter Medications as of 04/03/2021  Medication Sig   albuterol (VENTOLIN HFA) 108 (90 Base) MCG/ACT inhaler Inhale 2 puffs into the lungs every 6 (six) hours as needed for wheezing or shortness of breath.   amLODipine (NORVASC) 5 MG tablet Take 1 tablet (5 mg total) by mouth daily.   aspirin 81 MG tablet Take 81 mg by mouth daily.   carbidopa-levodopa (SINEMET CR) 50-200 MG tablet Take 1 tablet by mouth at bedtime.   carbidopa-levodopa (SINEMET IR) 25-100 MG tablet 2  tablets at 5am/ 1 tablet at 9am/1pm/5pm.   losartan-hydrochlorothiazide (HYZAAR) 50-12.5 MG tablet Take 1 tablet by mouth daily.   Multiple Vitamin (MULTIVITAMIN WITH MINERALS) TABS tablet Take 1 tablet by mouth daily.   sildenafil (VIAGRA) 100 MG tablet Take 0.5-1 tablets (50-100 mg total) by mouth daily as needed for erectile dysfunction.   [DISCONTINUED] simvastatin (ZOCOR) 40 MG tablet TAKE 1 TABLET BY MOUTH EVERY DAY AT NIGHT   simvastatin (ZOCOR) 20 MG tablet Take 1 tablet (20 mg total) by mouth at bedtime.   No facility-administered encounter medications on file as of 04/03/2021.    Surgical History: Past Surgical History:  Procedure Laterality Date   CARDIAC ELECTROPHYSIOLOGY STUDY AND ABLATION     COLONOSCOPY N/A 03/20/2021   Procedure: COLONOSCOPY;  Surgeon: Jonathon Bellows, MD;  Location: [facility] ENDOSCOPY;  Service: Gastroenterology;  Laterality: N/A;   EYE SURGERY     strabismus as child   KNEE ARTHROPLASTY Right     Medical History: Past Medical History:  Diagnosis Date   Asthma    Heart murmur    Hyperlipidemia    Hypertension    Parkinson disease ([place])     Family History: Family History  Problem Relation Age of Onset   COPD Mother    Multiple sclerosis Son    Healthy Daughter    Healthy Daughter    Hematuria Neg Hx    Kidney cancer Neg Hx    Prostate cancer Neg Hx     Social History  Socioeconomic History   Marital status: Married    Spouse name: Not on file   Number of children: Not on file   Years of education: Not on file   Highest education level: Not on file  Occupational History   Not on file  Tobacco Use   Smoking status: Never   Smokeless tobacco: Never  Vaping Use   Vaping Use: Never used  Substance and Sexual Activity   Alcohol use: Yes    Alcohol/week: 0.0 standard drinks    Comment: 1 beer/month   Drug use: No   Sexual activity: Not on file  Other Topics Concern   Not on file  Social History Narrative   Not on file   Social  Determinants of Health   Financial Resource Strain: Not on file  Food Insecurity: Not on file  Transportation Needs: Not on file  Physical Activity: Not on file  Stress: Not on file  Social Connections: Not on file  Intimate Partner Violence: Not on file      Review of Systems  Constitutional:  Negative for chills, fatigue and unexpected weight change.  HENT: Negative.  Negative for congestion, rhinorrhea, sneezing and sore throat.   Eyes:  Negative for redness.  Respiratory: Negative.  Negative for cough, chest tightness, shortness of breath and wheezing.   Cardiovascular: Negative.  Negative for chest pain and palpitations.  Gastrointestinal:  Negative for abdominal pain, constipation, diarrhea, nausea and vomiting.  Genitourinary:  Negative for dysuria and frequency.  Musculoskeletal:  Positive for myalgias (leg and hip pain right side). Negative for arthralgias, back pain, joint swelling and neck pain.  Skin:  Negative for rash.  Neurological: Negative.  Negative for tremors and numbness.  Hematological:  Negative for adenopathy. Does not bruise/bleed easily.  Psychiatric/Behavioral:  Negative for behavioral problems (Depression), sleep disturbance and suicidal ideas. The patient is not nervous/anxious.    Vital Signs: BP 138/88   Pulse 82   Temp 98.4 F (36.9 C)   Resp 16   Ht '5\' 10"'$  (1.778 m)   Wt 224 lb (101.6 kg)   SpO2 98%   BMI 32.14 kg/m    Physical Exam Vitals reviewed.  Constitutional:      Appearance: Normal appearance. He is not ill-appearing.  HENT:     Head: Normocephalic and atraumatic.  Cardiovascular:     Rate and Rhythm: Normal rate and regular rhythm.     Pulses: Normal pulses.     Heart sounds: Normal heart sounds. No murmur heard. Pulmonary:     Effort: Pulmonary effort is normal. No respiratory distress.     Breath sounds: Normal breath sounds. No wheezing.  Skin:    General: Skin is warm and dry.     Capillary Refill: Capillary refill  takes less than 2 seconds.  Neurological:     Mental Status: He is alert and oriented to person, place, and time.  Psychiatric:        Mood and Affect: Mood normal.        Behavior: Behavior normal.     Assessment/Plan: 1. OSA (obstructive sleep apnea) Cpap titration study discussed, results show that Nick[place]as is in need of CPAP to help with his OSA. DME order sent.  - For home use only DME continuous positive airway pressure (CPAP)  2. Mixed hyperlipidemia Patient had been experiencing right leg and hip pain, he is also on the maximum dose of simvastatin and amlodipine which do not mix well. It is recommended to be on no  more than 20 mg daily of simvastatin if also taking amlodipine. Dose decreased.  - simvastatin (ZOCOR) 20 MG tablet; Take 1 tablet (20 mg total) by mouth at bedtime.  Dispense: 90 tablet; Refill: 1  3. Essential hypertension Blood pressure is stable, continue medications as prescribed.    General Counseling: deke rench understanding of the findings of todays visit and agrees with plan of treatment. I have discussed any further diagnostic evaluation that may be needed or ordered today. We also reviewed his medications today. he has been encouraged to call the office with any questions or concerns that should arise related to todays visit.    Orders Placed This Encounter  Procedures   For home use only DME continuous positive airway pressure (CPAP)    Meds ordered this encounter  Medications   simvastatin (ZOCOR) 20 MG tablet    Sig: Take 1 tablet (20 mg total) by mouth at bedtime.    Dispense:  90 tablet    Refill:  1    Return in about 4 weeks (around 05/01/2021) for F/U, Echo @ [place], [place] @ [place] both test are scheduled this month, [place] PCP.   Total time spent:20 Minutes Time spent includes review of chart, medications, test results, and follow up plan with the patient.   [place] Controlled Substance Database was reviewed by me.  This patient was seen by  Jonetta Osgood, FNP-C in collaboration with Dr. Clayborn Bigness as a part of collaborative care agreement.   Raeden Schippers R. Valetta Fuller, MSN, FNP-C Internal medicine

## 2021-04-09 ENCOUNTER — Other Ambulatory Visit: Payer: Self-pay

## 2021-04-09 ENCOUNTER — Ambulatory Visit: Payer: No Typology Code available for payment source

## 2021-04-09 DIAGNOSIS — I2510 Atherosclerotic heart disease of native coronary artery without angina pectoris: Secondary | ICD-10-CM

## 2021-04-09 DIAGNOSIS — I5189 Other ill-defined heart diseases: Secondary | ICD-10-CM

## 2021-04-09 DIAGNOSIS — I6523 Occlusion and stenosis of bilateral carotid arteries: Secondary | ICD-10-CM

## 2021-04-09 DIAGNOSIS — I2583 Coronary atherosclerosis due to lipid rich plaque: Secondary | ICD-10-CM | POA: Diagnosis not present

## 2021-04-09 DIAGNOSIS — N528 Other male erectile dysfunction: Secondary | ICD-10-CM

## 2021-04-09 DIAGNOSIS — R300 Dysuria: Secondary | ICD-10-CM

## 2021-04-09 DIAGNOSIS — Z1211 Encounter for screening for malignant neoplasm of colon: Secondary | ICD-10-CM

## 2021-04-09 DIAGNOSIS — Z6831 Body mass index (BMI) 31.0-31.9, adult: Secondary | ICD-10-CM

## 2021-04-09 DIAGNOSIS — R0989 Other specified symptoms and signs involving the circulatory and respiratory systems: Secondary | ICD-10-CM

## 2021-04-09 DIAGNOSIS — G20 Parkinson's disease: Secondary | ICD-10-CM

## 2021-04-09 DIAGNOSIS — I10 Essential (primary) hypertension: Secondary | ICD-10-CM

## 2021-04-09 DIAGNOSIS — E782 Mixed hyperlipidemia: Secondary | ICD-10-CM

## 2021-04-09 DIAGNOSIS — Z0001 Encounter for general adult medical examination with abnormal findings: Secondary | ICD-10-CM

## 2021-04-09 DIAGNOSIS — G479 Sleep disorder, unspecified: Secondary | ICD-10-CM

## 2021-04-13 MED ORDER — SIMVASTATIN 20 MG PO TABS: 20.0000 mg | ORAL_TABLET | Freq: Every day | ORAL | 1 refills | Status: DC

## 2021-04-15 ENCOUNTER — Telehealth: Payer: Self-pay

## 2021-04-15 NOTE — Telephone Encounter (Signed)
-----   Message from Jonetta Osgood, NP sent at 04/13/2021 10:06 PM EDT ----- Regarding: simvastatin dose Hi Alex,  Please call the patient and let him know that his simvastatin '40mg'$  daily dose has been cut in half. He is currently on amlodipine for his blood pressure. When taking amlodipine, the dose of simvastatin should be no more than 20 mg. I have changed the order and sent it to the pharmacy, but if he has any 40 mg tablets left at home, he can cut or break them in half until he runs out. The new prescription will be for 20 mg tablets.  If he asks why it was changed, it is because amlodipine can cause a significant increase in the amount of simvastatin in the blood.  This could be the reason for his leg and hip pain, so decreasing the simvastatin may help that as well.   Thanks,  Alyssa

## 2021-04-15 NOTE — Telephone Encounter (Signed)
Spoke with pt, informed him of the dose change on his simvastatin from '40mg'$  to '20mg'$ . Informed him new script was sent to the pharmacy but if he still had the '40mg'$  he can cut them in half

## 2021-04-21 ENCOUNTER — Telehealth: Payer: Self-pay

## 2021-04-21 NOTE — Telephone Encounter (Signed)
CPAP order with settings of 10cmH20 has been sent to [facility] Patient.

## 2021-04-21 NOTE — Telephone Encounter (Signed)
Left vm to confirm 04/23/21 ultrasound-Toni

## 2021-04-22 ENCOUNTER — Telehealth: Payer: Self-pay

## 2021-04-22 NOTE — Procedures (Signed)
[place], [place] [id number]  DATE OF SERVICE: April 09, 2021  CAROTID DOPPLER INTERPRETATION:  Bilateral Carotid Ultrsasound and Color Doppler Examination was performed. The RIGHT CCA shows no significant plaque in the vessel. The LEFT CCA shows no significant plaque in the vessel. There was no intimal thickening noted in the RIGHT carotid artery. There was no intimal thickening in the LEFT carotid artery.  The RIGHT CCA shows peak systolic velocity of 84 cm per second. The end diastolic velocity is 36 cm per second on the RIGHT side. The RIGHT ICA shows peak systolic velocity of 91 per second. RIGHT sided ICA end diastolic velocity is 36 cm per second. The RIGHT ECA shows a peak systolic velocity of 95 cm per second. The ICA/CCA ratio is calculated to be 1.08. This suggests less than 50% stenosis. The Vertebral Artery shows antegrade flow.  The LEFT CCA shows peak systolic velocity of 97 cm per second. The end diastolic velocity is 26 cm per second on the LEFT side. The LEFT ICA shows peak systolic velocity of 89 per second. LEFT sided ICA end diastolic velocity is 44 cm per second. The LEFT ECA shows a peak systolic velocity of 81 cm per second. The ICA/CCA ratio is calculated to be 0.90. This suggests less than 50% stenosis. The Vertebral Artery shows antegrade flow.   Impression:    The RIGHT CAROTID shows less than 50% stenosis. The LEFT CAROTID shows less than 50% stenosis.  There is no plaque formation noted on the LEFT and no plaque on the RIGHT  side. Consider a repeat Carotid doppler if clinical situation and symptoms warrant in 6-12 months. Patient should be encouraged to change lifestyles such as smoking cessation, regular exercise and dietary modification. Use of statins in the right clinical setting and ASA is encouraged.  Allyne Gee, MD [facility] Pulmonary Critical Care Medicine

## 2021-04-22 NOTE — Telephone Encounter (Signed)
Spoke with Lorriane Shire regarding Cpap update, she estimated that pt should hear something from [facility] pt within the next 2 weeks and if he doesn't then pt should reach back out to [place]. LMOM to inform pt.

## 2021-04-23 ENCOUNTER — Other Ambulatory Visit: Payer: No Typology Code available for payment source

## 2021-04-25 ENCOUNTER — Other Ambulatory Visit: Payer: Self-pay | Admitting: Neurology

## 2021-04-25 ENCOUNTER — Other Ambulatory Visit: Payer: Self-pay | Admitting: Nurse Practitioner

## 2021-04-25 DIAGNOSIS — I10 Essential (primary) hypertension: Secondary | ICD-10-CM

## 2021-04-29 NOTE — Progress Notes (Signed)
Assessment/Plan:   1.  Parkinsons Disease, diagnosed July, 2019 with symptoms 2 years prior  -Continue carbidopa/levodopa 25/100, 2 tablets at 5 AM, 1 tablet at 9 AM/1 PM/5 PM  -Continue carbidopa/levodopa 50/200 at bedtime  -We discussed again adding pramipexole, but also discussed the risks of increasing daytime sleepiness.  He really doesn't want to add that right now.  We can discuss in the future  -needs to increase exercise  -We discussed that it used to be thought that levodopa would increase risk of melanoma but now it is believed that Parkinsons itself likely increases risk of melanoma. he is to get regular skin checks.   2.  REM behavior disorder  -This is commonly associated with PD and the patient is experiencing this.  We discussed that this can be very serious and even harmful.  We talked about medications as well as physical barriers to put in the bed (particularly soft bed rails, pillow barriers).  We talked about moving the night stand so that it is not so close to the side of the bed.  3.  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  4.  OSAS  -PSG in June, 2022 with AHI 10.  CPAP at 10 recommended.  Following with primary care.  Subjective:   James Bush was seen today in follow up for Parkinsons disease.  My previous records were reviewed prior to todays visit as well as outside records available to me.  Patient with his son who supplements the history.  No falls since last visit.  He was complaining about some lightheadedness.  Told him to talk to his primary care physician to see if any of his blood pressure medications could be lowered.  Reviewed NP notes from August and just noted that "blood pressure stable, continue medications as prescribed."  States today that he is still having dizziness with AM upon awakening.  He notes BP is lowering.  Saw the nurse  practitioner on April 03, 2021.  It was a follow-up from a sleep study.  Sleep study demonstrated AHI of 10.1.  Patient did have CPAP titration study and recommended CPAP at 10.  Awaiting on insurance for cpap approval.  He is not doing a lot of formal exercise.    Current prescribed movement disorder medications: carbidopa/levodopa 25/100, 2 tablets at 5 AM, 1 tablet at 9 AM/1 PM/5 PM  Carbidopa/levodopa 50/200 at bedtime   PREVIOUS MEDICATIONS: Carbidopa/levodopa 25/100 CR (I discontinued this when I first started seeing him to change him to the IR)  ALLERGIES:  No Known Allergies  CURRENT MEDICATIONS:  Outpatient Encounter Medications as of 05/01/2021  Medication Sig   albuterol (VENTOLIN HFA) 108 (90 Base) MCG/ACT inhaler Inhale 2 puffs into the lungs every 6 (six) hours as needed for wheezing or shortness of breath.   amLODipine (NORVASC) 5 MG tablet Take 1 tablet (5 mg total) by mouth daily.   aspirin 81 MG tablet Take 81 mg by mouth daily.   carbidopa-levodopa (SINEMET CR) 50-200 MG tablet Take 1 tablet by mouth at bedtime.   carbidopa-levodopa (SINEMET IR) 25-100 MG tablet 2 tablets at 5am/ 1 tablet at 9am/1pm/5pm.   losartan-hydrochlorothiazide (HYZAAR) 50-12.5 MG tablet Take 1 tablet by mouth daily.   Multiple Vitamin (MULTIVITAMIN WITH MINERALS) TABS tablet Take 1 tablet by mouth daily.   sildenafil (VIAGRA) 100 MG tablet Take 0.5-1 tablets (50-100 mg total) by mouth daily as  needed for erectile dysfunction.   simvastatin (ZOCOR) 20 MG tablet Take 1 tablet (20 mg total) by mouth at bedtime.   No facility-administered encounter medications on file as of 05/01/2021.    Objective:   PHYSICAL EXAMINATION:    VITALS:   Vitals:   05/01/21 0758  BP: 126/78  Pulse: 83  SpO2: 97%  Weight: 223 lb 6.4 oz (101.3 kg)  Height: '5\' 10"'$  (1.778 m)     GEN:  The patient appears stated age and is in NAD. HEENT:  Normocephalic, atraumatic.  The mucous membranes are moist.    Neurological examination:  Orientation: The patient is alert and oriented x3. Cranial nerves: There is good facial symmetry with minimal facial hypomimia. The speech is fluent and clear. Soft palate rises symmetrically and there is no tongue deviation. Hearing is intact to conversational tone. Sensation: Sensation is intact to light touch throughout Motor: Strength is at least antigravity x4.  Movement examination: Tone: There is  mild to moderate in the right upper extremity (same as last visit).  Tone in the left upper extremity is good.  Abnormal movements: none even with distraction Coordination:  There is mild decremation with RAM's, with any form of RAMS, including alternating supination and pronation of the forearm, hand opening and closing, finger taps, heel taps and toe taps, on the right.  Some decreased toe taps on the L Gait and Station: The patient has no difficulty arising out of a deep-seated chair without the use of the hands.  Patient slightly drags of the leg on the right. The patient has a negative pull test.       I have reviewed and interpreted the following labs independently    Chemistry      Component Value Date/Time   NA 140 03/28/2020 0922   K 4.3 03/28/2020 0922   CL 101 03/28/2020 0922   CO2 30 03/28/2020 0922   BUN 20 03/28/2020 0922   CREATININE 1.08 03/28/2020 0922      Component Value Date/Time   CALCIUM 9.1 03/28/2020 0922   ALKPHOS 44 03/28/2020 0922   AST 16 03/28/2020 0922   ALT <5 03/28/2020 0922   BILITOT 1.0 03/28/2020 0922       Lab Results  Component Value Date   WBC 6.3 03/28/2020   HGB 13.7 03/28/2020   HCT 41.9 03/28/2020   MCV 94.2 03/28/2020   PLT 219 03/28/2020    Lab Results  Component Value Date   TSH 1.041 03/28/2020     Total time spent on today's visit was 20 minutes, including both face-to-face time and nonface-to-face time.  Time included that spent on review of records (prior notes available to  me/labs/imaging if pertinent), discussing treatment and goals, answering patient's questions and coordinating care.  Cc:  Lavera Guise, MD

## 2021-04-30 ENCOUNTER — Ambulatory Visit: Payer: No Typology Code available for payment source

## 2021-04-30 ENCOUNTER — Other Ambulatory Visit: Payer: Self-pay

## 2021-04-30 DIAGNOSIS — E782 Mixed hyperlipidemia: Secondary | ICD-10-CM

## 2021-04-30 DIAGNOSIS — I10 Essential (primary) hypertension: Secondary | ICD-10-CM

## 2021-04-30 DIAGNOSIS — N528 Other male erectile dysfunction: Secondary | ICD-10-CM

## 2021-04-30 DIAGNOSIS — I5189 Other ill-defined heart diseases: Secondary | ICD-10-CM

## 2021-04-30 DIAGNOSIS — I2510 Atherosclerotic heart disease of native coronary artery without angina pectoris: Secondary | ICD-10-CM

## 2021-04-30 DIAGNOSIS — Z0001 Encounter for general adult medical examination with abnormal findings: Secondary | ICD-10-CM

## 2021-04-30 DIAGNOSIS — R300 Dysuria: Secondary | ICD-10-CM

## 2021-04-30 DIAGNOSIS — Z1212 Encounter for screening for malignant neoplasm of rectum: Secondary | ICD-10-CM

## 2021-04-30 DIAGNOSIS — Z1211 Encounter for screening for malignant neoplasm of colon: Secondary | ICD-10-CM

## 2021-04-30 DIAGNOSIS — G20 Parkinson's disease: Secondary | ICD-10-CM

## 2021-04-30 DIAGNOSIS — Z6831 Body mass index (BMI) 31.0-31.9, adult: Secondary | ICD-10-CM

## 2021-04-30 DIAGNOSIS — G479 Sleep disorder, unspecified: Secondary | ICD-10-CM

## 2021-04-30 DIAGNOSIS — R0989 Other specified symptoms and signs involving the circulatory and respiratory systems: Secondary | ICD-10-CM

## 2021-05-01 ENCOUNTER — Ambulatory Visit: Payer: No Typology Code available for payment source | Admitting: Nurse Practitioner

## 2021-05-01 ENCOUNTER — Encounter: Payer: Self-pay | Admitting: Nurse Practitioner

## 2021-05-01 ENCOUNTER — Ambulatory Visit: Payer: No Typology Code available for payment source | Admitting: Neurology

## 2021-05-01 ENCOUNTER — Encounter: Payer: Self-pay | Admitting: Neurology

## 2021-05-01 VITALS — BP 126/78 | HR 83 | Ht 70.0 in | Wt 223.4 lb

## 2021-05-01 VITALS — BP 136/86 | HR 85 | Temp 98.4°F | Resp 16 | Ht 70.0 in | Wt 223.0 lb

## 2021-05-01 DIAGNOSIS — I10 Essential (primary) hypertension: Secondary | ICD-10-CM

## 2021-05-01 DIAGNOSIS — I5189 Other ill-defined heart diseases: Secondary | ICD-10-CM

## 2021-05-01 DIAGNOSIS — G20 Parkinson's disease: Secondary | ICD-10-CM | POA: Diagnosis not present

## 2021-05-01 DIAGNOSIS — R0989 Other specified symptoms and signs involving the circulatory and respiratory systems: Secondary | ICD-10-CM

## 2021-05-01 MED ORDER — CARBIDOPA-LEVODOPA ER 50-200 MG PO TBCR: 1.0000 | EXTENDED_RELEASE_TABLET | Freq: Every day | ORAL | 1 refills | Status: DC

## 2021-05-01 MED ORDER — CARBIDOPA-LEVODOPA 25-100 MG PO TABS: ORAL_TABLET | ORAL | 1 refills | Status: DC

## 2021-05-01 NOTE — Patient Instructions (Signed)
As we discussed, it used to be thought that levodopa would increase risk of melanoma but now it is believed that Parkinsons itself likely increases risk of melanoma. I recommend yearly skin checks with a board certified dermatologist.  You can call [facility] or [facility] for an appointment. ° °[facility] °Address: [address], [place], [place] [address] °Phone: [phone] ° °[facility] °[address], [place], [place] °Phone: [phone] ° °

## 2021-05-01 NOTE — Progress Notes (Signed)
[facility] [place], [place] [id number]  Internal MEDICINE  Office Visit Note  Patient Name: James Bush  [id number]  [id number]  Date of Service: 05/01/2021  Chief Complaint  Patient presents with   Follow-up    Review [place] and BP   Hyperlipidemia   Hypertension   Asthma    HPI James [facility] presents for a follow up visit do discuss imaging results and hypertension. His blood pressure continues to be well-controlled with current medications and combining 2 of his medications into 1 pill has helped with his adherence. He had the bilateral carotid duplex recently and there was <50% stenosis  in the carotid arteries bilaterally per the result report. His echocardiogram results showed bilateral atrial cavity dilation, upper normal size ascending aorta, some diastolic dysfunction and an LVEF of 54.36%.     Current Medication: Outpatient Encounter Medications as of 05/01/2021  Medication Sig   albuterol (VENTOLIN HFA) 108 (90 Base) MCG/ACT inhaler Inhale 2 puffs into the lungs every 6 (six) hours as needed for wheezing or shortness of breath.   amLODipine (NORVASC) 5 MG tablet Take 1 tablet (5 mg total) by mouth daily.   aspirin 81 MG tablet Take 81 mg by mouth daily.   carbidopa-levodopa (SINEMET CR) 50-200 MG tablet Take 1 tablet by mouth at bedtime.   carbidopa-levodopa (SINEMET IR) 25-100 MG tablet 2 tablets at 5am/ 1 tablet at 9am/1pm/5pm.   losartan-hydrochlorothiazide (HYZAAR) 50-12.5 MG tablet Take 1 tablet by mouth daily.   Multiple Vitamin (MULTIVITAMIN WITH MINERALS) TABS tablet Take 1 tablet by mouth daily.   sildenafil (VIAGRA) 100 MG tablet Take 0.5-1 tablets (50-100 mg total) by mouth daily as needed for erectile dysfunction.   simvastatin ([place]) 20 MG tablet Take 1 tablet (20 mg total) by mouth at bedtime.   No facility-administered encounter medications on file as of 05/01/2021.    Surgical History: Past Surgical History:  Procedure Laterality Date    CARDIAC ELECTROPHYSIOLOGY STUDY AND ABLATION     COLONOSCOPY N/A 03/20/2021   Procedure: COLONOSCOPY;  Surgeon: James Bellows, MD;  Location: [facility];  Service: Gastroenterology;  Laterality: N/A;   EYE SURGERY     strabismus as child   KNEE ARTHROPLASTY Right     Medical History: Past Medical History:  Diagnosis Date   Asthma    Heart murmur    Hyperlipidemia    Hypertension    Parkinson disease ([place])     Family History: Family History  Problem Relation Age of Onset   COPD Mother    Multiple sclerosis Son    Healthy Daughter    Healthy Daughter    Hematuria Neg Hx    Kidney cancer Neg Hx    Prostate cancer Neg Hx     Social History   Socioeconomic History   Marital status: Married    Spouse name: Not on file   Number of children: Not on file   Years of education: Not on file   Highest education level: Not on file  Occupational History   Not on file  Tobacco Use   Smoking status: Never   Smokeless tobacco: Never  Vaping Use   Vaping Use: Never used  Substance and Sexual Activity   Alcohol use: Yes    Alcohol/week: 0.0 standard drinks    Comment: 1 beer/month   Drug use: No   Sexual activity: Not on file  Other Topics Concern   Not on file  Social History Narrative   Not on  file   Social Determinants of Health   Financial Resource Strain: Not on file  Food Insecurity: Not on file  Transportation Needs: Not on file  Physical Activity: Not on file  Stress: Not on file  Social Connections: Not on file  Intimate Partner Violence: Not on file      Review of Systems  Constitutional:  Negative for chills, fatigue and unexpected weight change.  HENT:  Negative for congestion, rhinorrhea, sneezing and sore throat.   Eyes:  Negative for redness.  Respiratory:  Negative for cough, chest tightness and shortness of breath.   Cardiovascular:  Negative for chest pain and palpitations.  Gastrointestinal:  Negative for abdominal pain, constipation,  diarrhea, nausea and vomiting.  Genitourinary:  Negative for dysuria and frequency.  Musculoskeletal:  Negative for arthralgias, back pain, joint swelling and neck pain.  Skin:  Negative for rash.  Neurological: Negative.  Negative for tremors and numbness.  Hematological:  Negative for adenopathy. Does not bruise/bleed easily.  Psychiatric/Behavioral:  Negative for behavioral problems (Depression), sleep disturbance and suicidal ideas. The patient is not nervous/anxious.    Vital Signs: BP 136/86   Pulse 85   Temp 98.4 F (36.9 C)   Resp 16   Ht '5\' 10"'$  (1.778 m)   Wt 223 lb (101.2 kg)   SpO2 97%   BMI 32.00 kg/m    Physical Exam Vitals reviewed.  Constitutional:      General: He is not in acute distress.    Appearance: Normal appearance. He is obese. He is not ill-appearing.  HENT:     Head: Normocephalic and atraumatic.  Eyes:     Extraocular Movements: Extraocular movements intact.     Pupils: Pupils are equal, round, and reactive to light.  Cardiovascular:     Rate and Rhythm: Normal rate and regular rhythm.  Pulmonary:     Effort: Pulmonary effort is normal. No respiratory distress.  Neurological:     Mental Status: He is alert and oriented to person, place, and time.     Cranial Nerves: No cranial nerve deficit.     Coordination: Coordination normal.     Gait: Gait normal.  Psychiatric:        Mood and Affect: Mood normal.        Behavior: Behavior normal.     Assessment/Plan: 1. Essential hypertension Stable with current medications, continue as prescribed.   2. Left ventricular hypokinesis Echo results discussed, recommendation is to repeat in 1 year to monitor for progression of ascending aorta size.   3. Bruit of left carotid artery Bilateral stenosis is less than 50 %, will monitor every 1-2 years as necessary.    General Counseling: [facility] understanding of the findings of todays visit and agrees with plan of treatment. I have discussed  any further diagnostic evaluation that may be needed or ordered today. We also reviewed his medications today. he has been encouraged to call the office with any questions or concerns that should arise related to todays visit.    No orders of the defined types were placed in this encounter.   No orders of the defined types were placed in this encounter.   Return in about 4 months (around 08/31/2021) for F/U, med refill, Areya Lemmerman PCP.   Total time spent:20 Minutes Time spent includes review of chart, medications, test results, and follow up plan with the patient.   [place] Controlled Substance Database was reviewed by me.  This patient was seen by Jonetta Osgood, FNP-C in  collaboration with Dr. Clayborn Bigness as a part of collaborative care agreement.   Louana Fontenot R. Valetta Fuller, MSN, FNP-C Internal medicine

## 2021-05-14 ENCOUNTER — Encounter: Payer: Self-pay | Admitting: Nurse Practitioner

## 2021-06-05 ENCOUNTER — Ambulatory Visit: Payer: No Typology Code available for payment source | Admitting: Nurse Practitioner

## 2021-06-18 ENCOUNTER — Other Ambulatory Visit: Payer: Self-pay

## 2021-06-18 ENCOUNTER — Ambulatory Visit (INDEPENDENT_AMBULATORY_CARE_PROVIDER_SITE_OTHER): Payer: No Typology Code available for payment source

## 2021-06-18 DIAGNOSIS — G4733 Obstructive sleep apnea (adult) (pediatric): Secondary | ICD-10-CM

## 2021-06-18 NOTE — Progress Notes (Signed)
95 percentile pressure 10   95th percentile leak 12.5   apnea-hypopnea index  0.0 /hr   total days used  >4 hr 25 days  total days used <4 hr 2 days  Total compliance 83 percent  He is doing good no problems or questions at this time  Pt was seen by Claiborne Billings from [facility]

## 2021-06-20 ENCOUNTER — Telehealth: Payer: Self-pay

## 2021-06-20 NOTE — Telephone Encounter (Signed)
Verbal order for use of medical home equipment signed by provider and placed in [facility] folder.

## 2021-06-23 ENCOUNTER — Other Ambulatory Visit: Payer: Self-pay

## 2021-06-23 ENCOUNTER — Other Ambulatory Visit: Payer: No Typology Code available for payment source

## 2021-06-23 DIAGNOSIS — C61 Malignant neoplasm of prostate: Secondary | ICD-10-CM

## 2021-06-24 LAB — FPSA% REFLEX
% FREE PSA: 26.4 %
PSA, FREE: 2.3 ng/mL

## 2021-06-24 LAB — PSA TOTAL (REFLEX TO FREE): Prostate Specific Ag, Serum: 8.7 ng/mL — ABNORMAL HIGH (ref 0.0–4.0)

## 2021-06-26 ENCOUNTER — Encounter: Payer: Self-pay | Admitting: Urology

## 2021-06-26 ENCOUNTER — Other Ambulatory Visit: Payer: Self-pay

## 2021-06-26 ENCOUNTER — Ambulatory Visit: Payer: No Typology Code available for payment source | Admitting: Urology

## 2021-06-26 VITALS — BP 144/72 | HR 90 | Ht 70.0 in | Wt 219.0 lb

## 2021-06-26 DIAGNOSIS — C61 Malignant neoplasm of prostate: Secondary | ICD-10-CM | POA: Diagnosis not present

## 2021-06-26 DIAGNOSIS — N529 Male erectile dysfunction, unspecified: Secondary | ICD-10-CM | POA: Diagnosis not present

## 2021-06-26 DIAGNOSIS — N3281 Overactive bladder: Secondary | ICD-10-CM | POA: Diagnosis not present

## 2021-06-26 NOTE — Patient Instructions (Signed)

## 2021-06-26 NOTE — Progress Notes (Signed)
   06/26/2021 9:36 AM   [name] 10-20-1960 [id number]  Reason for visit: Follow up very low risk prostate cancer, lower urinary tract symptoms, ED   HPI: I saw Mr. Spira in urology clinic today for follow-up of his very low risk prostate cancer.  Briefly he is a 60 year old African-American male with Parkinson's disease in the  who was diagnosed with very low risk prostate cancer in March 2017.  He does have a family history of non-lethal prostate cancer in his uncle.  PSA at time of initial diagnosis was 6.25, and biopsy showed 1/12 cores positive for Gleason score 3+3= 6 disease.  Confirmatory biopsy in October 2017 showed 2/12 cores of 3+3=6 disease, with max core involvement of 33%.  Prostate volume was 53 g, with PSA density of 0.13.   PSA  October 2022 is stable at 8.7(26% free) from 7.9 last year, 7.6 in May 2019, 7 in April 2018, and 8.5 in October 2017.   He deferred DRE today, has been asymmetric on the right side which has been stable in the past.   He has mild urinary symptoms of some frequency, and occasional urgency.  He is drinking a fair amount of diet [facility] during the day and coffee.  We reviewed behavioral strategies, as well as the impact of Parkinson's on bladder symptoms and overactive bladder.  Could consider Myrbetriq in the future if worsening OAB symptoms.   He was recently started on Viagra 50 mg as needed for ED which is working well.   Continue active surveillance for very low risk prostate cancer Behavioral strategies discussed regarding OAB RTC 1 year PSA prior  Billey Co, MD  [place] [address] [place], [place] [address] [phone]

## 2021-07-02 ENCOUNTER — Other Ambulatory Visit: Payer: Self-pay

## 2021-07-02 DIAGNOSIS — I10 Essential (primary) hypertension: Secondary | ICD-10-CM

## 2021-07-02 MED ORDER — LOSARTAN POTASSIUM-HCTZ 50-12.5 MG PO TABS: 1.0000 | ORAL_TABLET | Freq: Every day | ORAL | 1 refills | Status: DC

## 2021-07-31 ENCOUNTER — Other Ambulatory Visit: Payer: Self-pay

## 2021-07-31 ENCOUNTER — Ambulatory Visit (INDEPENDENT_AMBULATORY_CARE_PROVIDER_SITE_OTHER): Payer: No Typology Code available for payment source | Admitting: Internal Medicine

## 2021-07-31 ENCOUNTER — Encounter: Payer: Self-pay | Admitting: Internal Medicine

## 2021-07-31 VITALS — BP 136/88 | HR 89 | Temp 98.2°F | Ht 70.0 in | Wt 225.6 lb

## 2021-07-31 DIAGNOSIS — Z7189 Other specified counseling: Secondary | ICD-10-CM

## 2021-07-31 DIAGNOSIS — I10 Essential (primary) hypertension: Secondary | ICD-10-CM

## 2021-07-31 DIAGNOSIS — G4733 Obstructive sleep apnea (adult) (pediatric): Secondary | ICD-10-CM | POA: Diagnosis not present

## 2021-07-31 DIAGNOSIS — G20 Parkinson's disease: Secondary | ICD-10-CM | POA: Diagnosis not present

## 2021-07-31 NOTE — Progress Notes (Signed)
[facility] [place], [place] [address]  Pulmonary Sleep Medicine   Office Visit Note  Patient Name: James Bush DOB: 1961-02-05 MRN [id number]  Date of Service: 07/31/2021  Complaints/HPI: OSA new diagnosis. He states that he has been on CPAP since about July. Patient states the machine has helped him and is sleeping much better at night. He is not waking frequently. States he goes to the bathroom once at night. Snoring has improved. He feels less sleepy during the daytime. He has no issues with the mask and no issues with the PAP machine itself  ROS  General: (-) fever, (-) chills, (-) night sweats, (-) weakness Skin: (-) rashes, (-) itching,. Eyes: (-) visual changes, (-) redness, (-) itching. Nose and Sinuses: (-) nasal stuffiness or itchiness, (-) postnasal drip, (-) nosebleeds, (-) sinus trouble. Mouth and Throat: (-) sore throat, (-) hoarseness. Neck: (-) swollen glands, (-) enlarged thyroid, (-) neck pain. Respiratory: - cough, (-) bloody sputum, - shortness of breath, - wheezing. Cardiovascular: - ankle swelling, (-) chest pain. Lymphatic: (-) lymph node enlargement. Neurologic: (-) numbness, (-) tingling. Psychiatric: (-) anxiety, (-) depression   Current Medication: Outpatient Encounter Medications as of 07/31/2021  Medication Sig   albuterol (VENTOLIN HFA) 108 (90 Base) MCG/ACT inhaler Inhale 2 puffs into the lungs every 6 (six) hours as needed for wheezing or shortness of breath.   amLODipine (NORVASC) 5 MG tablet Take 1 tablet (5 mg total) by mouth daily.   aspirin 81 MG tablet Take 81 mg by mouth daily.   carbidopa-levodopa (SINEMET CR) 50-200 MG tablet Take 1 tablet by mouth at bedtime.   carbidopa-levodopa (SINEMET IR) 25-100 MG tablet 2 tablets at 5am/ 1 tablet at 9am/1pm/5pm.   losartan-hydrochlorothiazide (HYZAAR) 50-12.5 MG tablet Take 1 tablet by mouth daily.   Multiple Vitamin (MULTIVITAMIN WITH MINERALS) TABS tablet Take 1 tablet  by mouth daily.   sildenafil (VIAGRA) 100 MG tablet Take 0.5-1 tablets (50-100 mg total) by mouth daily as needed for erectile dysfunction.   simvastatin (ZOCOR) 20 MG tablet Take 1 tablet (20 mg total) by mouth at bedtime.   No facility-administered encounter medications on file as of 07/31/2021.    Surgical History: Past Surgical History:  Procedure Laterality Date   CARDIAC ELECTROPHYSIOLOGY STUDY AND ABLATION     COLONOSCOPY N/A 03/20/2021   Procedure: COLONOSCOPY;  Surgeon: Jonathon Bellows, MD;  Location: [facility] ENDOSCOPY;  Service: Gastroenterology;  Laterality: N/A;   EYE SURGERY     strabismus as child   KNEE ARTHROPLASTY Right     Medical History: Past Medical History:  Diagnosis Date   Asthma    Heart murmur    Hyperlipidemia    Hypertension    Parkinson disease ([place])     Family History: Family History  Problem Relation Age of Onset   COPD Mother    Multiple sclerosis Son    Healthy Daughter    Healthy Daughter    Hematuria Neg Hx    Kidney cancer Neg Hx    Prostate cancer Neg Hx     Social History: Social History   Socioeconomic History   Marital status: Married    Spouse name: Not on file   Number of children: Not on file   Years of education: Not on file   Highest education level: Not on file  Occupational History   Not on file  Tobacco Use   Smoking status: Never   Smokeless tobacco: Never  Vaping Use   Vaping  Use: Never used  Substance and Sexual Activity   Alcohol use: Yes    Alcohol/week: 0.0 standard drinks    Comment: 1 beer/month   Drug use: No   Sexual activity: Not on file  Other Topics Concern   Not on file  Social History Narrative   Not on file   Social Determinants of Health   Financial Resource Strain: Not on file  Food Insecurity: Not on file  Transportation Needs: Not on file  Physical Activity: Not on file  Stress: Not on file  Social Connections: Not on file  Intimate Partner Violence: Not on file    Vital  Signs: Blood pressure 136/88, pulse 89, temperature 98.2 F (36.8 C), height 5\' 10"  (1.778 m), weight 225 lb 9.6 oz (102.3 kg), SpO2 96 %.  Examination: General Appearance: The patient is well-developed, well-nourished, and in no distress. Skin: Gross inspection of skin unremarkable. Head: normocephalic, no gross deformities. Eyes: no gross deformities noted. ENT: ears appear grossly normal no exudates. Neck: Supple. No thyromegaly. No LAD. Respiratory: no rhonchi noted at this time. Cardiovascular: Normal S1 and S2 without murmur or rub. Extremities: No cyanosis. pulses are equal. Neurologic: Alert and oriented. No involuntary movements.  LABS: Recent Results (from the past 2160 hour(s))  PSA Total (Reflex To Free)     Status: Abnormal   Collection Time: 06/23/21  3:28 PM  Result Value Ref Range   Prostate Specific Ag, Serum 8.7 (H) 0.0 - 4.0 ng/mL    Comment: Roche ECLIA methodology. According to the American Urological Association, Serum PSA should decrease and remain at undetectable levels after radical prostatectomy. The AUA defines biochemical recurrence as an initial PSA value 0.2 ng/mL or greater followed by a subsequent confirmatory PSA value 0.2 ng/mL or greater. Values obtained with different assay methods or kits cannot be used interchangeably. Results cannot be interpreted as absolute evidence of the presence or absence of malignant disease.    Reflex Criteria Comment     Comment: The percent free PSA is performed on a reflex basis only when the total PSA is between 4.0 and 10.0 ng/mL.   %fPSA Reflex     Status: None   Collection Time: 06/23/21  3:28 PM  Result Value Ref Range   PSA, FREE 2.30 N/A ng/mL    Comment: Roche ECLIA methodology.   % FREE PSA 26.4 %    Comment: The table below lists the probability of prostate cancer for men with non-suspicious DRE results and total PSA between 4 and 10 ng/mL, by patient age Ricci Barker, [place], [id number]).                    % Free PSA       50-64 yr        65-75 yr                   0.00-10.00%        56%             55%                  10.01-15.00%        24%             35%                  15.01-20.00%        17%             23%  20.01-25.00%        10%             20%                       >25.00%         5%              9% Please note:  Catalona et al did not make specific               recommendations regarding the use of               percent free PSA for any other population               of men.     Radiology: No results found.  No results found.  No results found.    Assessment and Plan: Patient Active Problem List   Diagnosis Date Noted   OSA (obstructive sleep apnea) 03/14/2021   Other hypersomnia 03/14/2021   Left ventricular hypokinesis 05/06/2020   Coronary artery disease due to lipid rich plaque 05/06/2020   Hypertension, poor control 10/20/2018   Dyspnea on exertion 08/18/2018   Parkinson's disease ([place]) 04/14/2018   Tremor 03/17/2018   Essential hypertension 02/10/2018   Hyperlipidemia 02/10/2018   Encounter for general adult medical examination with abnormal findings 02/10/2018   Right-sided muscle weakness 02/10/2018   Dysuria 02/10/2018    1. Obstructive sleep apnea Encourage compliance with the CPAP therapy patient appears to be doing well so far supplies will be updated  2. Parkinson's disease ([place]) On carbidopa levodopa monitor for augmentation and also for restless legs.  3. CPAP use counseling CPAP Counseling: had a lengthy discussion with the patient regarding the importance of PAP therapy in management of the sleep apnea. Patient appears to understand the risk factor reduction and also understands the risks associated with untreated sleep apnea. Patient will try to make a good faith effort to remain compliant with therapy. Also instructed the patient on proper cleaning of the device including the water must be changed daily if  possible and use of distilled water is preferred. Patient understands that the machine should be regularly cleaned with appropriate recommended cleaning solutions that do not damage the PAP machine for example given white vinegar and water rinses. Other methods such as ozone treatment may not be as good as these simple methods to achieve cleaning.   4. Essential hypertension Hypertension Counseling:   The following hypertensive lifestyle modification were recommended and discussed:  1. Limiting alcohol intake to less than 1 oz/day of ethanol:(24 oz of beer or 8 oz of wine or 2 oz of 100-proof whiskey). 2. Take baby ASA 81 mg daily. 3. Importance of regular aerobic exercise and losing weight. 4. Reduce dietary saturated fat and cholesterol intake for overall cardiovascular health. 5. Maintaining adequate dietary potassium, calcium, and magnesium intake. 6. Regular monitoring of the blood pressure. 7. Reduce sodium intake to less than 100 mmol/day (less than 2.3 gm of sodium or less than 6 gm of sodium choride)     General Counseling: I have discussed the findings of the evaluation and examination with Vicente Males.  I have also discussed any further diagnostic evaluation thatmay be needed or ordered today. Frans verbalizes understanding of the findings of todays visit. We also reviewed his medications today and discussed drug interactions and side effects including but not limited excessive drowsiness and altered mental states. We also discussed that  there is always a risk not just to him but also people around him. he has been encouraged to call the office with any questions or concerns that should arise related to todays visit.  No orders of the defined types were placed in this encounter.    Time spent: 65  I have personally obtained a history, examined the patient, evaluated laboratory and imaging results, formulated the assessment and plan and placed orders.    Allyne Gee, MD [facility] Pulmonary  and Critical Care Sleep medicine

## 2021-07-31 NOTE — Patient Instructions (Signed)
Sleep Apnea ?Sleep apnea affects breathing during sleep. It causes breathing to stop for 10 seconds or more, or to become shallow. People with sleep apnea usually snore loudly. ?It can also increase the risk of: ?Heart attack. ?Stroke. ?Being very overweight (obese). ?Diabetes. ?Heart failure. ?Irregular heartbeat. ?High blood pressure. ?The goal of treatment is to help you breathe normally again. ?What are the causes? ?The most common cause of this condition is a collapsed or blocked airway. ?There are three kinds of sleep apnea: ?Obstructive sleep apnea. This is caused by a blocked or collapsed airway. ?Central sleep apnea. This happens when the brain does not send the right signals to the muscles that control breathing. ?Mixed sleep apnea. This is a combination of obstructive and central sleep apnea. ?What increases the risk? ?Being overweight. ?Smoking. ?Having a small airway. ?Being older. ?Being male. ?Drinking alcohol. ?Taking medicines to calm yourself (sedatives or tranquilizers). ?Having family members with the condition. ?Having a tongue or tonsils that are larger than normal. ?What are the signs or symptoms? ?Trouble staying asleep. ?Loud snoring. ?Headaches in the morning. ?Waking up gasping. ?Dry mouth or sore throat in the morning. ?Being sleepy or tired during the day. ?If you are sleepy or tired during the day, you may also: ?Not be able to focus your mind (concentrate). ?Forget things. ?Get angry a lot and have mood swings. ?Feel sad (depressed). ?Have changes in your personality. ?Have less interest in sex, if you are male. ?Be unable to have an erection, if you are male. ?How is this treated? ? ?Sleeping on your side. ?Using a medicine to get rid of mucus in your nose (decongestant). ?Avoiding the use of alcohol, medicines to help you relax, or certain pain medicines (narcotics). ?Losing weight, if needed. ?Changing your diet. ?Quitting smoking. ?Using a machine to open your airway while you  sleep, such as: ?An oral appliance. This is a mouthpiece that shifts your lower jaw forward. ?A CPAP device. This device blows air through a mask when you breathe out (exhale). ?An EPAP device. This has valves that you put in each nostril. ?A BIPAP device. This device blows air through a mask when you breathe in (inhale) and breathe out. ?Having surgery if other treatments do not work. ?Follow these instructions at home: ?Lifestyle ?Make changes that your doctor recommends. ?Eat a healthy diet. ?Lose weight if needed. ?Avoid alcohol, medicines to help you relax, and some pain medicines. ?Do not smoke or use any products that contain nicotine or tobacco. If you need help quitting, ask your doctor. ?General instructions ?Take over-the-counter and prescription medicines only as told by your doctor. ?If you were given a machine to use while you sleep, use it only as told by your doctor. ?If you are having surgery, make sure to tell your doctor you have sleep apnea. You may need to bring your device with you. ?Keep all follow-up visits. ?Contact a doctor if: ?The machine that you were given to use during sleep bothers you or does not seem to be working. ?You do not get better. ?You get worse. ?Get help right away if: ?Your chest hurts. ?You have trouble breathing in enough air. ?You have an uncomfortable feeling in your back, arms, or stomach. ?You have trouble talking. ?One side of your body feels weak. ?A part of your face is hanging down. ?These symptoms may be an emergency. Get help right away. Call your local emergency services (911 in the [place] ?Do not wait to see if the symptoms   will go away. ?Do not drive yourself to the hospital. ?Summary ?This condition affects breathing during sleep. ?The most common cause is a collapsed or blocked airway. ?The goal of treatment is to help you breathe normally while you sleep. ?This information is not intended to replace advice given to you by your health care provider. Make  sure you discuss any questions you have with your health care provider. ?Document Revised: 03/26/2021 Document Reviewed: 07/26/2020 ?Elsevier Patient Education ? 2022 Elsevier Inc. ? ?

## 2021-09-04 ENCOUNTER — Other Ambulatory Visit: Payer: Self-pay

## 2021-09-04 ENCOUNTER — Encounter: Payer: Self-pay | Admitting: Nurse Practitioner

## 2021-09-04 ENCOUNTER — Ambulatory Visit: Payer: No Typology Code available for payment source | Admitting: Nurse Practitioner

## 2021-09-04 VITALS — BP 136/80 | HR 80 | Temp 98.2°F | Resp 16 | Ht 70.0 in | Wt 222.2 lb

## 2021-09-04 DIAGNOSIS — G20 Parkinson's disease: Secondary | ICD-10-CM

## 2021-09-04 DIAGNOSIS — I10 Essential (primary) hypertension: Secondary | ICD-10-CM

## 2021-09-04 DIAGNOSIS — E782 Mixed hyperlipidemia: Secondary | ICD-10-CM | POA: Diagnosis not present

## 2021-09-04 DIAGNOSIS — M545 Low back pain, unspecified: Secondary | ICD-10-CM

## 2021-09-04 MED ORDER — SIMVASTATIN 20 MG PO TABS: 20.0000 mg | ORAL_TABLET | Freq: Every day | ORAL | 1 refills | Status: DC

## 2021-09-04 MED ORDER — LOSARTAN POTASSIUM-HCTZ 50-12.5 MG PO TABS: 1.0000 | ORAL_TABLET | Freq: Every day | ORAL | 1 refills | Status: DC

## 2021-09-04 MED ORDER — AMLODIPINE BESYLATE 5 MG PO TABS: 5.0000 mg | ORAL_TABLET | Freq: Every day | ORAL | 1 refills | Status: DC

## 2021-09-04 MED ORDER — METHOCARBAMOL 500 MG PO TABS: 500.0000 mg | ORAL_TABLET | Freq: Three times a day (TID) | ORAL | 0 refills | Status: DC | PRN

## 2021-09-04 NOTE — Progress Notes (Signed)
[facility] [place], [place] [address]  Internal MEDICINE  Office Visit Note  Patient Name: James Bush  [id number]  [id number]  Date of Service: 09/04/2021  Chief Complaint  Patient presents with   Follow-up   Medication Refill   Hyperlipidemia   Hypertension   Asthma    HPI James Bush presents for a follow up visit for hypertension and medication refills. His blood pressure is well controlled on current medications. His sinemet dose was increased by his neurologist since his previous office visit and he reports that his tremors have improved.     Current Medication: Outpatient Encounter Medications as of 09/04/2021  Medication Sig   albuterol (VENTOLIN HFA) 108 (90 Base) MCG/ACT inhaler Inhale 2 puffs into the lungs every 6 (six) hours as needed for wheezing or shortness of breath.   aspirin 81 MG tablet Take 81 mg by mouth daily.   carbidopa-levodopa (SINEMET CR) 50-200 MG tablet Take 1 tablet by mouth at bedtime.   carbidopa-levodopa (SINEMET IR) 25-100 MG tablet 2 tablets at 5am/ 1 tablet at 9am/1pm/5pm.   methocarbamol (ROBAXIN) 500 MG tablet Take 1 tablet (500 mg total) by mouth every 8 (eight) hours as needed for muscle spasms.   Multiple Vitamin (MULTIVITAMIN WITH MINERALS) TABS tablet Take 1 tablet by mouth daily.   sildenafil (VIAGRA) 100 MG tablet Take 0.5-1 tablets (50-100 mg total) by mouth daily as needed for erectile dysfunction.   [DISCONTINUED] amLODipine (NORVASC) 5 MG tablet Take 1 tablet (5 mg total) by mouth daily.   [DISCONTINUED] losartan-hydrochlorothiazide (HYZAAR) 50-12.5 MG tablet Take 1 tablet by mouth daily.   [DISCONTINUED] simvastatin (ZOCOR) 20 MG tablet Take 1 tablet (20 mg total) by mouth at bedtime.   amLODipine (NORVASC) 5 MG tablet Take 1 tablet (5 mg total) by mouth daily.   losartan-hydrochlorothiazide (HYZAAR) 50-12.5 MG tablet Take 1 tablet by mouth daily.   simvastatin (ZOCOR) 20 MG tablet Take 1 tablet (20 mg total)  by mouth at bedtime.   No facility-administered encounter medications on file as of 09/04/2021.    Surgical History: Past Surgical History:  Procedure Laterality Date   CARDIAC ELECTROPHYSIOLOGY STUDY AND ABLATION     COLONOSCOPY N/A 03/20/2021   Procedure: COLONOSCOPY;  Surgeon: Jonathon Bellows, MD;  Location: [facility] ENDOSCOPY;  Service: Gastroenterology;  Laterality: N/A;   EYE SURGERY     strabismus as child   KNEE ARTHROPLASTY Right     Medical History: Past Medical History:  Diagnosis Date   Asthma    Heart murmur    Hyperlipidemia    Hypertension    Parkinson disease ([place])     Family History: Family History  Problem Relation Age of Onset   COPD Mother    Multiple sclerosis Son    Healthy Daughter    Healthy Daughter    Hematuria Neg Hx    Kidney cancer Neg Hx    Prostate cancer Neg Hx     Social History   Socioeconomic History   Marital status: Married    Spouse name: Not on file   Number of children: Not on file   Years of education: Not on file   Highest education level: Not on file  Occupational History   Not on file  Tobacco Use   Smoking status: Never   Smokeless tobacco: Never  Vaping Use   Vaping Use: Never used  Substance and Sexual Activity   Alcohol use: Yes    Alcohol/week: 0.0 standard drinks  Comment: 1 beer/month   Drug use: No   Sexual activity: Not on file  Other Topics Concern   Not on file  Social History Narrative   Not on file   Social Determinants of Health   Financial Resource Strain: Not on file  Food Insecurity: Not on file  Transportation Needs: Not on file  Physical Activity: Not on file  Stress: Not on file  Social Connections: Not on file  Intimate Partner Violence: Not on file      Review of Systems  Constitutional:  Negative for chills, fatigue and unexpected weight change.  HENT:  Negative for congestion, rhinorrhea, sneezing and sore throat.   Eyes:  Negative for redness.  Respiratory:  Negative for cough,  chest tightness and shortness of breath.   Cardiovascular:  Negative for chest pain and palpitations.  Gastrointestinal:  Negative for abdominal pain, constipation, diarrhea, nausea and vomiting.  Genitourinary:  Negative for dysuria and frequency.  Musculoskeletal:  Negative for arthralgias, back pain, joint swelling and neck pain.  Skin:  Negative for rash.  Neurological:  Positive for tremors. Negative for numbness.  Hematological:  Negative for adenopathy. Does not bruise/bleed easily.  Psychiatric/Behavioral:  Negative for behavioral problems (Depression), sleep disturbance and suicidal ideas. The patient is not nervous/anxious.    Vital Signs: BP 136/80    Pulse 80    Temp 98.2 F (36.8 C)    Resp 16    Ht 5\' 10"  (1.778 m)    Wt 222 lb 3.2 oz (100.8 kg)    SpO2 98%    BMI 31.88 kg/m    Physical Exam Vitals reviewed.  Constitutional:      General: He is not in acute distress.    Appearance: Normal appearance. He is obese. He is not ill-appearing.  HENT:     Head: Normocephalic and atraumatic.  Eyes:     Pupils: Pupils are equal, round, and reactive to light.  Cardiovascular:     Rate and Rhythm: Normal rate and regular rhythm.  Pulmonary:     Effort: Pulmonary effort is normal. No respiratory distress.  Neurological:     Mental Status: He is alert and oriented to person, place, and time.     Cranial Nerves: No cranial nerve deficit.     Coordination: Coordination normal.     Gait: Gait normal.  Psychiatric:        Mood and Affect: Mood normal.        Behavior: Behavior normal.       Assessment/Plan: 1. Essential hypertension Continue current medications as prescribed. Refills ordered.  - losartan-hydrochlorothiazide (HYZAAR) 50-12.5 MG tablet; Take 1 tablet by mouth daily.  Dispense: 90 tablet; Refill: 1 - amLODipine (NORVASC) 5 MG tablet; Take 1 tablet (5 mg total) by mouth daily.  Dispense: 90 tablet; Refill: 1  2. Acute midline low back pain without  sciatica Methocarbamol prescribed to help with muscle spasms and musculoskeletal pain.  - methocarbamol (ROBAXIN) 500 MG tablet; Take 1 tablet (500 mg total) by mouth every 8 (eight) hours as needed for muscle spasms.  Dispense: 30 tablet; Refill: 0  3. Mixed hyperlipidemia Continue as prescribed, refills ordered.  - simvastatin (ZOCOR) 20 MG tablet; Take 1 tablet (20 mg total) by mouth at bedtime.  Dispense: 90 tablet; Refill: 1  4. Parkinson's disease ([place]) Tremors have been under better control since    General Counseling: James Bush verbalizes understanding of the findings of todays visit and agrees with plan of treatment. I have discussed  any further diagnostic evaluation that may be needed or ordered today. We also reviewed his medications today. he has been encouraged to call the office with any questions or concerns that should arise related to todays visit.    No orders of the defined types were placed in this encounter.   Meds ordered this encounter  Medications   methocarbamol (ROBAXIN) 500 MG tablet    Sig: Take 1 tablet (500 mg total) by mouth every 8 (eight) hours as needed for muscle spasms.    Dispense:  30 tablet    Refill:  0   losartan-hydrochlorothiazide (HYZAAR) 50-12.5 MG tablet    Sig: Take 1 tablet by mouth daily.    Dispense:  90 tablet    Refill:  1   simvastatin (ZOCOR) 20 MG tablet    Sig: Take 1 tablet (20 mg total) by mouth at bedtime.    Dispense:  90 tablet    Refill:  1   amLODipine (NORVASC) 5 MG tablet    Sig: Take 1 tablet (5 mg total) by mouth daily.    Dispense:  90 tablet    Refill:  1    Return in about 6 months (around 03/04/2022) for previously scheduled, CPE, Lurena Naeve PCP.   Total time spent:30 Minutes Time spent includes review of chart, medications, test results, and follow up plan with the patient.   [place] Controlled Substance Database was reviewed by me.  This patient was seen by Jonetta Osgood, FNP-C in collaboration with Dr. Clayborn Bigness as a part of collaborative care agreement.   Azariya Freeman R. Valetta Fuller, MSN, FNP-C Internal medicine

## 2021-09-26 ENCOUNTER — Other Ambulatory Visit: Payer: Self-pay | Admitting: Internal Medicine

## 2021-11-11 NOTE — Progress Notes (Signed)
? ? ?Assessment/Plan:  ? ?1.  Parkinsons Disease, diagnosed July, 2019 with symptoms 2 years prior ? -Continue carbidopa/levodopa 25/100, 2 tablets at 5 AM, 1 tablet at 9 AM/1 PM/5 PM ? -Continue carbidopa/levodopa 50/200 at bedtime  ? -We discussed again adding pramipexole, as we have last several visits, but also discussed the risks of increasing daytime sleepiness.  He really doesn't want to add that right now.  We can continue to address if needed or desired ? -needs to increase exercise and we spent much of the visit discussing the importance of this.  Wife does not disagree.  Patient does still work, but discussed that he needs to find time to exercise.  He was given information on a free clinical trial at [facility], which would give him access to free physical therapy  ? -We discussed that it used to be thought that levodopa would increase risk of melanoma but now it is believed that Parkinsons itself likely increases risk of melanoma. he is to get regular skin checks. ? ? ?2.  REM behavior disorder ? -This is commonly associated with PD and the patient is experiencing this.  We discussed that this can be very serious and even harmful.  We talked about medications as well as physical barriers to put in the bed (particularly soft bed rails, pillow barriers).  We talked about moving the night stand so that it is not so close to the side of the bed. ? ?3.  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 -Patient on multiple blood pressure medications that may need to be titrated down.  We have sent notes to nurse practitioner who is managing the blood pressure, but she reports she is happy with control.  There is no doubt his control of blood pressure is good (blood pressure was only 408 systolic), but with Parkinson's disease, generally we have to allow a little bit of permissive hypertension.  This will need to be monitored closely with time. ? ?4.  OSAS ? -PSG in June, 2022 with AHI 10.  CPAP at  10 recommended.  Following with primary care. ? ?Subjective:  ? ?James Bush was seen today in follow up for Parkinsons disease.  My previous records were reviewed prior to todays visit as well as outside records available to me.  Patient with his wife who supplements the history.  No falls since last visit.  The last several visits we have discussed lightheadedness, and the fact that his multiple blood pressure medications are likely contributing.  We have asked the nurse practitioner who is managing the blood pressure medications to address this.  Last visit, he actually saw her the same day he saw me and she reported that she was happy with his well-controlled blood pressure.  There was no mention of the continued dizziness he complains of each time he is here.  He does not feel near syncopal.  He is talking and loud in dreams but no hitting/falling out of the bed.   ? ?Current prescribed movement disorder medications: ?carbidopa/levodopa 25/100, 2 tablets at 5 AM, 1 tablet at 9 AM/1 PM/5 PM  ?Carbidopa/levodopa 50/200 at bedtime ? ? ?PREVIOUS MEDICATIONS: Carbidopa/levodopa 25/100 CR (I discontinued this when I first started seeing him to change him to the IR) ? ?ALLERGIES:  No Known Allergies ? ?CURRENT MEDICATIONS:  ?Outpatient Encounter Medications as of 11/13/2021  ?Medication Sig  ? albuterol (VENTOLIN HFA) 108 (90 Base) MCG/ACT inhaler Inhale 2 puffs into the lungs every 6 (six) hours  as needed for wheezing or shortness of breath.  ? amLODipine (NORVASC) 5 MG tablet Take 1 tablet (5 mg total) by mouth daily.  ? aspirin 81 MG tablet Take 81 mg by mouth daily.  ? carbidopa-levodopa (SINEMET CR) 50-200 MG tablet Take 1 tablet by mouth at bedtime.  ? carbidopa-levodopa (SINEMET IR) 25-100 MG tablet 2 tablets at 5am/ 1 tablet at 9am/1pm/5pm.  ? hydrochlorothiazide (HYDRODIURIL) 25 MG tablet TAKE 1 TABLET BY MOUTH EVERY DAY FOR BLOOD PRESSURE  ? losartan-hydrochlorothiazide (HYZAAR) 50-12.5 MG tablet Take 1  tablet by mouth daily.  ? methocarbamol (ROBAXIN) 500 MG tablet Take 1 tablet (500 mg total) by mouth every 8 (eight) hours as needed for muscle spasms.  ? Multiple Vitamin (MULTIVITAMIN WITH MINERALS) TABS tablet Take 1 tablet by mouth daily.  ? sildenafil (VIAGRA) 100 MG tablet Take 0.5-1 tablets (50-100 mg total) by mouth daily as needed for erectile dysfunction.  ? simvastatin (ZOCOR) 20 MG tablet Take 1 tablet (20 mg total) by mouth at bedtime.  ? ?No facility-administered encounter medications on file as of 11/13/2021.  ? ? ?Objective:  ? ?PHYSICAL EXAMINATION:   ? ?VITALS:   ?Vitals:  ? 11/13/21 1102  ?BP: 119/79  ?Pulse: 72  ?SpO2: 99%  ?Weight: 219 lb 6.4 oz (99.5 kg)  ?Height: '5\' 10"'$  (1.778 m)  ? ? ? ? ?GEN:  The patient appears stated age and is in NAD. ?HEENT:  Normocephalic, atraumatic.  The mucous membranes are moist.  ? ?Neurological examination: ? ?Orientation: The patient is alert and oriented x3. ?Cranial nerves: There is good facial symmetry with minimal facial hypomimia. The speech is fluent and clear. Soft palate rises symmetrically and there is no tongue deviation. Hearing is intact to conversational tone. ?Sensation: Sensation is intact to light touch throughout ?Motor: Strength is at least antigravity x4. ? ?Movement examination: ?Tone: There is  mild to moderate in the right upper extremity (same as last several visits ).  Tone in the left upper extremity is good.  ?Abnormal movements: none even with distraction ?Coordination:  There is mild decremation with RAM's, with any form of RAMS, including alternating supination and pronation of the forearm, hand opening and closing, finger taps, heel taps and toe taps, on the right.  Some decreased toe taps on the L ?Gait and Station: The patient has no difficulty arising out of a deep-seated chair without the use of the hands.  He ambulates well in the hall, admitting that he is trying to swing the right arm. ? ?I have reviewed and interpreted the  following labs independently ? ?  Chemistry   ?   ?Component Value Date/Time  ? NA 140 03/28/2020 0922  ? K 4.3 03/28/2020 0922  ? CL 101 03/28/2020 0922  ? CO2 30 03/28/2020 0922  ? BUN 20 03/28/2020 0922  ? CREATININE 1.08 03/28/2020 0922  ?    ?Component Value Date/Time  ? CALCIUM 9.1 03/28/2020 0922  ? ALKPHOS 44 03/28/2020 0922  ? AST 16 03/28/2020 0922  ? ALT <5 03/28/2020 0922  ? BILITOT 1.0 03/28/2020 0938  ?  ? ? ? ?Lab Results  ?Component Value Date  ? WBC 6.3 03/28/2020  ? HGB 13.7 03/28/2020  ? HCT 41.9 03/28/2020  ? MCV 94.2 03/28/2020  ? PLT 219 03/28/2020  ? ? ?Lab Results  ?Component Value Date  ? TSH 1.041 03/28/2020  ? ? ? ?Total time spent on today's visit was 30 minutes, including both face-to-face time and nonface-to-face time.  Time included that spent on review of records (prior notes available to me/labs/imaging if pertinent), discussing treatment and goals, answering patient's questions and coordinating care. ? ?Cc:  Lavera Guise, MD ? ?

## 2021-11-13 ENCOUNTER — Encounter: Payer: Self-pay | Admitting: Neurology

## 2021-11-13 ENCOUNTER — Other Ambulatory Visit: Payer: Self-pay

## 2021-11-13 ENCOUNTER — Ambulatory Visit: Payer: No Typology Code available for payment source | Admitting: Neurology

## 2021-11-13 VITALS — BP 119/79 | HR 72 | Ht 70.0 in | Wt 219.4 lb

## 2021-11-13 DIAGNOSIS — G20 Parkinson's disease: Secondary | ICD-10-CM | POA: Diagnosis not present

## 2021-11-13 MED ORDER — CARBIDOPA-LEVODOPA ER 50-200 MG PO TBCR: 1.0000 | EXTENDED_RELEASE_TABLET | Freq: Every day | ORAL | 1 refills | Status: DC

## 2021-11-13 MED ORDER — CARBIDOPA-LEVODOPA 25-100 MG PO TABS
ORAL_TABLET | ORAL | 1 refills | Status: DC
Start: 2021-11-13 — End: 2022-05-07

## 2021-11-13 NOTE — Patient Instructions (Signed)
Local and Online Resources for Power over Parkinson's Group ?March 2023 ? ?LOCAL [place] PARKINSON'S GROUPS  ?Power over Parkinson's Group :   ?Power Over Parkinson's Patient Education Group will be Wednesday, March 8th-*Hybrid meting*- in person at [place] location and via [facility] at 2:00 pm.   ?Upcoming Power over Parkinson's Meetings:  2nd Wednesdays of the month at 2 pm:  March 8th, April 12th ?Contact Amy Marriott at [email or web address] if interested in participating in this group ?Parkinson's Care Partners Group:    3rd Mondays, Contact Misty Paladino ?Atypical Parkinsonian Patient Group:   4th Wednesdays, Contact Misty Paladino ?If you are interested in participating in these groups with Misty, please contact her directly for how to join those meetings.  Her contact information is [email or web address].   ? ?LOCAL EVENTS AND NEW OFFERINGS ?Parkinson's Wellness Event:  Pottery Night at [facility].  Friday, March 10th 5:30-7:30 pm.  Sponsored by [employer] [place].  FREE event for people with Parkinson's and care partners.  RSVP to [facility] [email or web address] ?Dine out at [employer].  Celebrate Parkinson's disease Awareness Month and Support the Parkinson's Movement Disorder Fund.   Wednesday, April 19th 4-6 pm at [place], [employer].  (Give receipt to cashier and 20% will be donated) ?Parkinson's T-shirts for sale!  Designed by a local group member, with funds going to [place].  $20.00  Contact Misty to purchase (see email above) ?New PWR! Moves Class offering at [employer]!  Fridays 1-2 pm in March (likely to switch days and times after March).  Come try it out and see if PWR! Moves is a good fit for your exercise routine!  Contact Amy Marriott for details:  [email or web address] ?Hamil-Kerr Challenge Bike, Run, Walk for PD, PSP, MSA.    Saturday, April 8th at 8 am.  [place]   Proceeds go to offset costs of exercise programs locally.  To Register, visit website:  [email or web address] ? ?ONLINE EDUCATION AND SUPPORT ?[place]:  www.[email or web address] ?PD Health at Home continues:  Mindfulness Mondays, Expert Briefing Tuesdays, Wellness Wednesdays, Take Time Thursdays, Fitness Fridays  ?Upcoming Education:  ?Parkinson's and Medications:  What's New.  Wednesday, March 8th at 1:00 pm. ?Freezing and Fall Prevention in Parkinson's.  Wednesday, April 12th at 1:00 pm ?Register for Armed forces operational officer) at [email or web address] ?[place] Chapter Parkinson's Symposium: Cognition Changes- a free in-person ([place], [place]) and online Audiological scientist) event for people with Parkinson's and their loved ones. During this event we will explore new treatments and practical strategies for addressing these changes.  Saturday, April 1st, 10 am-1 pm.  Register at [email or web address] or contact [place] at [phone] or [email or web address]. ? Please check out their website to sign up for emails and see their full online offerings ? ?[place]:  [email or web address]  ?Third Thursday Webinars:  On the third Thursday of every month at 12 p.m. ET, join our free live webinars to learn about various aspects of living with Parkinson's disease and our work to speed medical breakthroughs. ?Upcoming Webinar:  The Power of Women in Philanthropy.  Tues, March 28th at  12 pm. ?Check out additional information on their website to see their full online offerings ? ?[place]:  [email or web address] ?Upcoming Webinar:   Stay tuned ?Webinar Series:  Living with Parkinson's Meetup.   Third Thursdays of each month at 3 pm ?Care Partner Monthly Meetup.  With Robin Searing Phinney.  First Tuesday of each month, 2 pm ?Check out additional information  to Live Well Today on their  website ? ?Parkinson and Movement Disorders (PMD) Alliance:  www.[email or web address] ?[employer] Online:  Online Education Events ?Sign up for emails, which are sent weekly to give you updates on programming and online offerings ? ?Parkinson's Association of the [place]:  www.[email or web address] ?Information on online support groups, education events, and online exercises including Yoga, Parkinson's exercises and more-LOTS of information on links to PD resources and online events ?Virtual Support Group through [employer] of the [place]; next one is scheduled for Wednesday, *April 5th at 2 pm. *March meeting has been cancelled. (These are typically scheduled for the 1st Wednesday of the month at 2 pm).  Visit website for details. ?MOVEMENT AND EXERCISE OPPORTUNITIES ?Parkinson's DRUMMING Classes/Music Therapy with Doylene Canning:  This is a returning class and it's FREE!  2nd Mondays, continuing March 13th , 11:00 at the [place].  Contact *Misty Taylor-Paladino at [email or web address] or Doylene Canning at [phone] or [email or web address]  ?PWR! Moves Classes at [place].  Wednesdays 10 and 11 am.   NEW PWR! Moves Class offering at [employer].  Fridays 1-2 pm.  Contact Amy Gerrit Friends, PT [email or web address] if interested. ?Here is a link to the PWR!Moves classes on Zoom from [place] - Daily Mon-Sat at 10:00. Via Zoom, FREE and open to all.  There is also a link below via Facebook if you use that platform. ?[email or web address] ?[email or web address] ? ?Parkinson's Wellness Recovery (PWR! Moves)  www.[email or web address] ?Info on the PWR! Virtual Experience:  You will have access to our expertise through  self-assessment, guided plans that start with the PD-specific fundamentals, educational content, tips, Q&A with an expert, and a growing Art therapist of PD-specific pre-recorded and live exercise classes of varying types and intensity - both physical and cognitive! If that is not enough, we offer 1:1 wellness consultations (in-person or virtual) to personalize your PWR! Research scientist (medical).  ?[employer] Fridays:  ?As part of the PD Health @ Home program, this free video series focuses each week on one aspect of fitness designed to support people living with Parkinson's.  These weekly videos highlight the [place] recent fitness guidelines for people with Parkinson's disease. ?www.KVTVnet.com.cy ?Dance for PD website is offering free, live-stream classes throughout the week, as well as links to [employer] of classes:  [email or web address] ?Dance for Parkinson's in-person class.  February 1-April 26, Wednesdays 4-5 pm.  Free class for people with Parkinson's disease, at [address] [place], [place].  Contact [phone] or [email or web address] to register ?Virtual dance and Pilates for Parkinson's classes: Click on the Community Tab> Parkinson's Movement Initiative Tab.  To register for classes and for more information, visit [email or web address] and click the ?community? tab.  ?[employer] Parkinson's Cycling Classes  ?[employer]:  Thursdays @ Noon-Live classes at [employer] ([employer] at [email or web address] or [phone]) ?[employer]: Virtual Classes Mondays and Thursdays Jeanette Caprice classes Tuesday, Wednesday and Thursday (contact [place] at [email or web address]  or [phone]) ?[employer] ?Varied levels of classes are offered Mondays, Tuesdays and Thursdays at [employer].  ?Stretching with Verdis Frederickson weekly class is also offered for people with  Parkinson's ?To observe a class or for more information, call [phone] or email Hezzie Bump at [email or web address] ?ADDITIONAL SUPPORT AND RESOURCES ?[employer]:Online Caregiver Education Opportunities:

## 2021-12-17 ENCOUNTER — Ambulatory Visit: Payer: No Typology Code available for payment source

## 2021-12-17 DIAGNOSIS — G4733 Obstructive sleep apnea (adult) (pediatric): Secondary | ICD-10-CM | POA: Diagnosis not present

## 2021-12-17 NOTE — Progress Notes (Signed)
?   95 percentile pressure 10 ? ? 95th percentile leak 15.2 ? ? apnea-hypopnea index  0.0 /hr ? ? total days used  >4 hr 90 days ? total days used <4 hr 6 days ? ?Total compliance 93 percent ? ?He is doing great no problems or questions at this time ?Pt was seen by Claiborne Billings  RRT/RCP  from [facility]  ?

## 2022-01-01 DIAGNOSIS — Z0279 Encounter for issue of other medical certificate: Secondary | ICD-10-CM

## 2022-01-02 ENCOUNTER — Telehealth: Payer: Self-pay | Admitting: Neurology

## 2022-01-02 DIAGNOSIS — G20 Parkinson's disease: Secondary | ICD-10-CM

## 2022-01-02 NOTE — Telephone Encounter (Signed)
Called patient about forms that Dr. Carles Collet declined to complete, refund requested. ? ?Patient is requesting a call back from Dr. Doristine Devoid medical assistant, he has questions. ?

## 2022-01-02 NOTE — Telephone Encounter (Signed)
Pt stated that he [facility] not [facility] any one at [facility] he only sees Dr Tat and his PCP. He stated that he is unable to do his job and he needs the disability paperwork done,  ?

## 2022-01-02 NOTE — Telephone Encounter (Signed)
Pt called back an informed that functional capacity eval for completion.  We just don't complete those here.  [facility] those. Referral placed to [facility] to get paperwork done  ?

## 2022-01-29 ENCOUNTER — Ambulatory Visit: Payer: No Typology Code available for payment source | Admitting: Internal Medicine

## 2022-02-10 ENCOUNTER — Telehealth: Payer: Self-pay | Admitting: Neurology

## 2022-02-10 NOTE — Telephone Encounter (Signed)
Called patient and he is needing Dr.Tat to fill out the bottom portion of his form that states medial diagnosis. Waiting tor form to be faxed over

## 2022-02-10 NOTE — Telephone Encounter (Signed)
Patient called referring to forms that he had needed filled out.  Please return his call.

## 2022-02-12 ENCOUNTER — Encounter: Payer: Self-pay | Admitting: Internal Medicine

## 2022-02-12 ENCOUNTER — Ambulatory Visit: Payer: No Typology Code available for payment source | Admitting: Internal Medicine

## 2022-02-12 VITALS — BP 132/73 | HR 80 | Temp 98.1°F | Resp 16 | Ht 70.0 in | Wt 221.4 lb

## 2022-02-12 DIAGNOSIS — G20 Parkinson's disease: Secondary | ICD-10-CM | POA: Diagnosis not present

## 2022-02-12 DIAGNOSIS — G4733 Obstructive sleep apnea (adult) (pediatric): Secondary | ICD-10-CM | POA: Diagnosis not present

## 2022-02-12 DIAGNOSIS — Z7189 Other specified counseling: Secondary | ICD-10-CM

## 2022-02-12 DIAGNOSIS — R0602 Shortness of breath: Secondary | ICD-10-CM | POA: Diagnosis not present

## 2022-02-12 NOTE — Patient Instructions (Signed)

## 2022-02-12 NOTE — Telephone Encounter (Signed)
Called patient and left voicemail.

## 2022-02-12 NOTE — Progress Notes (Signed)
[facility] [place], [place] [address]  Pulmonary Sleep Medicine   Office Visit Note  Patient Name: James Bush DOB: 07/11/1961 MRN [id number]  Date of Service: 02/12/2022  Complaints/HPI: OSA on CPAP. He is doing well. Has 93% compliance and has been feeling much better. Patient states he is sleeping at night feels refreshed in am.  He does need to work on weigh tloss. He has not been able to make much progress. His goal is to work on 20 pounds of weight loss. No admissions and no other major pulm issues  ROS  General: (-) fever, (-) chills, (-) night sweats, (-) weakness Skin: (-) rashes, (-) itching,. Eyes: (-) visual changes, (-) redness, (-) itching. Nose and Sinuses: (-) nasal stuffiness or itchiness, (-) postnasal drip, (-) nosebleeds, (-) sinus trouble. Mouth and Throat: (-) sore throat, (-) hoarseness. Neck: (-) swollen glands, (-) enlarged thyroid, (-) neck pain. Respiratory: - cough, (-) bloody sputum, + shortness of breath, - wheezing. Cardiovascular: - ankle swelling, (-) chest pain. Lymphatic: (-) lymph node enlargement. Neurologic: (-) numbness, (-) tingling. Psychiatric: (-) anxiety, (-) depression   Current Medication: Outpatient Encounter Medications as of 02/12/2022  Medication Sig   albuterol (VENTOLIN HFA) 108 (90 Base) MCG/ACT inhaler Inhale 2 puffs into the lungs every 6 (six) hours as needed for wheezing or shortness of breath.   amLODipine (NORVASC) 5 MG tablet Take 1 tablet (5 mg total) by mouth daily.   aspirin 81 MG tablet Take 81 mg by mouth daily.   carbidopa-levodopa (SINEMET CR) 50-200 MG tablet Take 1 tablet by mouth at bedtime.   carbidopa-levodopa (SINEMET IR) 25-100 MG tablet 2 tablets at 5am/ 1 tablet at 9am/1pm/5pm.   hydrochlorothiazide (HYDRODIURIL) 25 MG tablet TAKE 1 TABLET BY MOUTH EVERY DAY FOR BLOOD PRESSURE   losartan-hydrochlorothiazide (HYZAAR) 50-12.5 MG tablet Take 1 tablet by mouth daily.    methocarbamol (ROBAXIN) 500 MG tablet Take 1 tablet (500 mg total) by mouth every 8 (eight) hours as needed for muscle spasms.   Multiple Vitamin (MULTIVITAMIN WITH MINERALS) TABS tablet Take 1 tablet by mouth daily.   sildenafil (VIAGRA) 100 MG tablet Take 0.5-1 tablets (50-100 mg total) by mouth daily as needed for erectile dysfunction.   simvastatin (ZOCOR) 20 MG tablet Take 1 tablet (20 mg total) by mouth at bedtime.   No facility-administered encounter medications on file as of 02/12/2022.    Surgical History: Past Surgical History:  Procedure Laterality Date   CARDIAC ELECTROPHYSIOLOGY STUDY AND ABLATION     COLONOSCOPY N/A 03/20/2021   Procedure: COLONOSCOPY;  Surgeon: Jonathon Bellows, MD;  Location: [facility] ENDOSCOPY;  Service: Gastroenterology;  Laterality: N/A;   EYE SURGERY     strabismus as child   KNEE ARTHROPLASTY Right     Medical History: Past Medical History:  Diagnosis Date   Asthma    Heart murmur    Hyperlipidemia    Hypertension    Parkinson disease ([place])     Family History: Family History  Problem Relation Age of Onset   COPD Mother    Multiple sclerosis Son    Healthy Daughter    Healthy Daughter    Hematuria Neg Hx    Kidney cancer Neg Hx    Prostate cancer Neg Hx     Social History: Social History   Socioeconomic History   Marital status: Married    Spouse name: Not on file   Number of children: Not on file   Years of education:  Not on file   Highest education level: Not on file  Occupational History   Not on file  Tobacco Use   Smoking status: Never   Smokeless tobacco: Never  Vaping Use   Vaping Use: Never used  Substance and Sexual Activity   Alcohol use: Yes    Alcohol/week: 0.0 standard drinks of alcohol    Comment: 1 beer/month   Drug use: No   Sexual activity: Not on file  Other Topics Concern   Not on file  Social History Narrative   Not on file   Social Determinants of Health   Financial Resource Strain: Not on file  Food  Insecurity: Not on file  Transportation Needs: Not on file  Physical Activity: Not on file  Stress: Not on file  Social Connections: Not on file  Intimate Partner Violence: Not on file    Vital Signs: Blood pressure 132/73, pulse 80, temperature 98.1 F (36.7 C), resp. rate 16, height '5\' 10"'$  (1.778 m), weight 221 lb 6.4 oz (100.4 kg), SpO2 98 %.  Examination: General Appearance: The patient is well-developed, well-nourished, and in no distress. Skin: Gross inspection of skin unremarkable. Head: normocephalic, no gross deformities. Eyes: no gross deformities noted. ENT: ears appear grossly normal no exudates. Neck: Supple. No thyromegaly. No LAD. Respiratory: no rhonchi. Cardiovascular: Normal S1 and S2 without murmur or rub. Extremities: No cyanosis. pulses are equal. Neurologic: Alert and oriented. No involuntary movements.  LABS: No results found for this or any previous visit (from the past 2160 hour(s)).  Radiology: No results found.  No results found.  No results found.    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 SOB (shortness of breath)  - Spirometry with Graph  2. Obstructive sleep apnea Under good control compliance is excellent continue with CPAP therapy  3. Parkinson's disease [facility]) Patient is to continue to follow with the primary neurologist for ongoing treatment.  As symptoms do appear to be  under decent control  4. CPAP use counseling CPAP Counseling: had a lengthy discussion with the patient regarding the importance of PAP therapy in management of the sleep apnea. Patient appears to understand the risk factor reduction and also understands the risks associated with untreated sleep apnea. Patient will try to make a good faith effort to remain compliant with therapy. Also instructed the patient on proper cleaning of the device including the water must be changed daily if possible and use of distilled water is preferred. Patient understands that the machine should be regularly cleaned with appropriate recommended cleaning solutions that do not damage the PAP machine for example given white vinegar and water rinses. Other methods such as ozone treatment may not be as good as these simple methods to achieve cleaning.   5. Obesity, morbid ([place]) Work on diet as much exercise as possible and portion control for as far as weight loss is concerned  General Counseling: I have discussed the findings of the evaluation and examination with  Jaelan.  I have also discussed any further diagnostic evaluation thatmay be needed or ordered today. Vaughn verbalizes understanding of the findings of todays visit. We also reviewed his medications today and discussed drug interactions and side effects including but not limited excessive drowsiness and altered mental states. We also discussed that there is always a risk not just to him but also people around him. he has been encouraged to call the office with any questions or concerns that should arise related to todays visit.  Orders Placed This Encounter  Procedures   Spirometry with Graph    Order Specific Question:   Where should this test be performed?    Answer:   [facility]    Order Specific Question:   Basic spirometry    Answer:   Yes     Time spent: 35  I have personally obtained a history, examined the patient, evaluated laboratory and  imaging results, formulated the assessment and plan and placed orders.    Allyne Gee, MD [facility] Pulmonary and Critical Care Sleep medicine

## 2022-03-02 ENCOUNTER — Encounter: Payer: Self-pay | Admitting: Neurology

## 2022-03-02 ENCOUNTER — Telehealth: Payer: Self-pay

## 2022-03-02 NOTE — Telephone Encounter (Signed)
Called pateint and let him know that Dr. Carles Collet wrote a letter and I have faxed the letter to [facility]

## 2022-03-09 ENCOUNTER — Encounter: Payer: Self-pay | Admitting: Nurse Practitioner

## 2022-03-09 ENCOUNTER — Telehealth: Payer: Self-pay

## 2022-03-09 ENCOUNTER — Ambulatory Visit (INDEPENDENT_AMBULATORY_CARE_PROVIDER_SITE_OTHER): Payer: No Typology Code available for payment source | Admitting: Internal Medicine

## 2022-03-09 DIAGNOSIS — R498 Other voice and resonance disorders: Secondary | ICD-10-CM

## 2022-03-09 DIAGNOSIS — Z0001 Encounter for general adult medical examination with abnormal findings: Secondary | ICD-10-CM

## 2022-03-09 DIAGNOSIS — I10 Essential (primary) hypertension: Secondary | ICD-10-CM | POA: Diagnosis not present

## 2022-03-09 DIAGNOSIS — G20 Parkinson's disease: Secondary | ICD-10-CM

## 2022-03-09 DIAGNOSIS — R300 Dysuria: Secondary | ICD-10-CM

## 2022-03-09 DIAGNOSIS — E782 Mixed hyperlipidemia: Secondary | ICD-10-CM

## 2022-03-09 DIAGNOSIS — G4733 Obstructive sleep apnea (adult) (pediatric): Secondary | ICD-10-CM

## 2022-03-09 MED ORDER — ROPINIROLE HCL 0.25 MG PO TABS: ORAL_TABLET | ORAL | 1 refills | Status: DC

## 2022-03-09 NOTE — Progress Notes (Signed)
[facility] [place], [place] [address]  Internal MEDICINE  Office Visit Note  Patient Name: James Bush  [id number]  [id number]  Date of Service: 03/09/2022  Chief Complaint  Patient presents with   Annual Exam   Shortness of Breath    For 2 months   Hypertension   Hyperlipidemia   Asthma     HPI Pt is here for routine health maintenance examination 1.  His main complaint today is having fatigue questionable shortness of breath after having a conversation.  He is unable to pinpoint whether it is shortness of breath or fatigue, he denies any chest pain and there is no shortness of breath or chest pain with ambulation he is active at work. 2.  Patient has history of parkinsonism disease and followed by neurology according to him they want to increase his medication but he declined at the time. 3.  Patient also has blood pressure takes Norvasc, losartan hydrochlorothiazide 50/12.5 MG 4.  Patient is unable to take a statin therapy due to muscle fatigue and worsening of parkinsonism disease 5.  Patient also had a low EF and diastolic dysfunction, dilation of ascending aorta as well and since then has been on CPAP machine, tolerance is excellent  Current Medication: Outpatient Encounter Medications as of 03/09/2022  Medication Sig   albuterol (VENTOLIN HFA) 108 (90 Base) MCG/ACT inhaler Inhale 2 puffs into the lungs every 6 (six) hours as needed for wheezing or shortness of breath.   amLODipine (NORVASC) 5 MG tablet Take 1 tablet (5 mg total) by mouth daily.   aspirin 81 MG tablet Take 81 mg by mouth daily.   carbidopa-levodopa (SINEMET CR) 50-200 MG tablet Take 1 tablet by mouth at bedtime.   carbidopa-levodopa (SINEMET IR) 25-100 MG tablet 2 tablets at 5am/ 1 tablet at 9am/1pm/5pm.   hydrochlorothiazide (HYDRODIURIL) 25 MG tablet TAKE 1 TABLET BY MOUTH EVERY DAY FOR BLOOD PRESSURE   losartan-hydrochlorothiazide (HYZAAR) 50-12.5 MG tablet Take 1 tablet by  mouth daily.   methocarbamol (ROBAXIN) 500 MG tablet Take 1 tablet (500 mg total) by mouth every 8 (eight) hours as needed for muscle spasms.   Multiple Vitamin (MULTIVITAMIN WITH MINERALS) TABS tablet Take 1 tablet by mouth daily.   rOPINIRole (REQUIP) 0.25 MG tablet Take one tab a day for parkisnonism   sildenafil (VIAGRA) 100 MG tablet Take 0.5-1 tablets (50-100 mg total) by mouth daily as needed for erectile dysfunction.   [DISCONTINUED] simvastatin (ZOCOR) 20 MG tablet Take 1 tablet (20 mg total) by mouth at bedtime. (Patient not taking: Reported on 03/09/2022)   No facility-administered encounter medications on file as of 03/09/2022.    Surgical History: Past Surgical History:  Procedure Laterality Date   CARDIAC ELECTROPHYSIOLOGY STUDY AND ABLATION     COLONOSCOPY N/A 03/20/2021   Procedure: COLONOSCOPY;  Surgeon: Jonathon Bellows, MD;  Location: [facility] ENDOSCOPY;  Service: Gastroenterology;  Laterality: N/A;   EYE SURGERY     strabismus as child   KNEE ARTHROPLASTY Right     Medical History: Past Medical History:  Diagnosis Date   Asthma    Heart murmur    Hyperlipidemia    Hypertension    Parkinson disease ([place])     Family History: Family History  Problem Relation Age of Onset   COPD Mother    Multiple sclerosis Son    Healthy Daughter    Healthy Daughter    Hematuria Neg Hx    Kidney cancer Neg Hx  Prostate cancer Neg Hx     Social History: Social History   Socioeconomic History   Marital status: Married    Spouse name: Not on file   Number of children: Not on file   Years of education: Not on file   Highest education level: Not on file  Occupational History   Not on file  Tobacco Use   Smoking status: Never   Smokeless tobacco: Never  Vaping Use   Vaping Use: Never used  Substance and Sexual Activity   Alcohol use: Yes    Alcohol/week: 0.0 standard drinks of alcohol    Comment: 1 beer/month   Drug use: No   Sexual activity: Not on file  Other Topics  Concern   Not on file  Social History Narrative   Not on file   Social Determinants of Health   Financial Resource Strain: Not on file  Food Insecurity: Not on file  Transportation Needs: Not on file  Physical Activity: Not on file  Stress: Not on file  Social Connections: Not on file      Review of Systems  Constitutional:  Positive for fatigue. Negative for chills and unexpected weight change.  HENT:  Positive for postnasal drip. Negative for congestion, rhinorrhea, sneezing and sore throat.   Eyes:  Negative for redness.  Respiratory:  Positive for shortness of breath. Negative for cough and chest tightness.   Cardiovascular:  Negative for chest pain and palpitations.  Gastrointestinal:  Negative for abdominal pain, constipation, diarrhea, nausea and vomiting.  Genitourinary:  Negative for dysuria and frequency.  Musculoskeletal:  Negative for arthralgias, back pain, joint swelling and neck pain.  Skin:  Negative for rash.  Neurological: Negative.  Negative for tremors and numbness.  Hematological:  Negative for adenopathy. Does not bruise/bleed easily.  Psychiatric/Behavioral:  Negative for behavioral problems (Depression), sleep disturbance and suicidal ideas. The patient is not nervous/anxious.      Vital Signs: BP 136/90   Pulse 88   Temp 98.5 F (36.9 C)   Resp 16   Ht '5\' 10"'$  (1.778 m)   Wt 222 lb (100.7 kg)   SpO2 97%   BMI 31.85 kg/m    Physical Exam Constitutional:      General: He is not in acute distress.    Appearance: He is well-developed. He is not diaphoretic.  HENT:     Head: Normocephalic and atraumatic.     Mouth/Throat:     Pharynx: No oropharyngeal exudate.  Eyes:     Pupils: Pupils are equal, round, and reactive to light.  Neck:     Thyroid: No thyromegaly.     Vascular: No JVD.     Trachea: No tracheal deviation.  Cardiovascular:     Rate and Rhythm: Normal rate and regular rhythm.     Heart sounds: Normal heart sounds. No murmur  heard.    No friction rub. No gallop.  Pulmonary:     Effort: Pulmonary effort is normal. No respiratory distress.     Breath sounds: No wheezing or rales.  Chest:     Chest wall: No tenderness.  Abdominal:     General: Bowel sounds are normal.     Palpations: Abdomen is soft.  Musculoskeletal:        General: Normal range of motion.     Cervical back: Normal range of motion and neck supple.  Lymphadenopathy:     Cervical: No cervical adenopathy.  Skin:    General: Skin is warm and dry.  Neurological:     Mental Status: He is alert and oriented to person, place, and time.     Cranial Nerves: No cranial nerve deficit.  Psychiatric:        Behavior: Behavior normal.        Thought Content: Thought content normal.        Judgment: Judgment normal.         Assessment/Plan: 1. Encounter for general adult medical examination with abnormal findings All preventive health maintenance is updated, PSAs followed by urology last PSA level has been elevated  2. Essential hypertension Continue Norvasc and losartan as before - Comprehensive metabolic panel  3. Vocal fatigue Patient has vocal fatigue not sure if it is because of uncontrolled parkinsonism disease low-dose Requip is given to as a trial to see if this improves his symptoms, patient called back to the office for further recommendations - rOPINIRole (REQUIP) 0.25 MG tablet; Take one tab a day for parkisnonism  Dispense: 90 tablet; Refill: 1 - CBC with Differential/Platelet - TSH - T4, free  4. Mixed hyperlipidemia Patient is unable to tolerate statin due to muscle fatigue - Lipid Panel With LDL/HDL ratio - TSH - T4, free  5. Parkinson's disease ([place]) Patient is on Sinemet followed by neurology - T4, free - Comprehensive metabolic panel  6. OSA (obstructive sleep apnea) Continue CPAP as before  7. Dysuria PSA  - UA/M w/rflx Culture, Routine   General Counseling: [place] verbalizes understanding of the findings  of todays visit and agrees with plan of treatment. I have discussed any further diagnostic evaluation that may be needed or ordered today. We also reviewed his medications today. he has been encouraged to call the office with any questions or concerns that should arise related to todays visit.    Counseling:  [place] Controlled Substance Database was reviewed by me.  Orders Placed This Encounter  Procedures   UA/M w/rflx Culture, Routine   CBC with Differential/Platelet   Lipid Panel With LDL/HDL Ratio   TSH   T4, free   Comprehensive metabolic panel    Meds ordered this encounter  Medications   rOPINIRole (REQUIP) 0.25 MG tablet    Sig: Take one tab a day for parkisnonism    Dispense:  90 tablet    Refill:  1    Total time spent:35 Minutes  Time spent includes review of chart, medications, test results, and follow up plan with the patient.     Lavera Guise, MD  Internal Medicine

## 2022-03-09 NOTE — Telephone Encounter (Signed)
Spoke with [facility] phar he only filled combination pres for Losartan-HCTZ no refill HCTZ since 11/22 d/c  HCTZ 25 mg

## 2022-03-13 ENCOUNTER — Other Ambulatory Visit: Payer: Self-pay | Admitting: Nurse Practitioner

## 2022-03-13 ENCOUNTER — Other Ambulatory Visit: Payer: Self-pay | Admitting: Internal Medicine

## 2022-03-13 DIAGNOSIS — I10 Essential (primary) hypertension: Secondary | ICD-10-CM

## 2022-03-13 DIAGNOSIS — N528 Other male erectile dysfunction: Secondary | ICD-10-CM

## 2022-03-13 LAB — UA/M W/RFLX CULTURE, ROUTINE
Bilirubin, UA: NEGATIVE
Glucose, UA: NEGATIVE
Nitrite, UA: NEGATIVE
Protein,UA: NEGATIVE
RBC, UA: NEGATIVE
Specific Gravity, UA: 1.024 (ref 1.005–1.030)
Urobilinogen, Ur: 0.2 mg/dL (ref 0.2–1.0)
pH, UA: 6.5 (ref 5.0–7.5)

## 2022-03-13 LAB — MICROSCOPIC EXAMINATION
Bacteria, UA: NONE SEEN
Casts: NONE SEEN /lpf
RBC, Urine: NONE SEEN /hpf (ref 0–2)

## 2022-03-13 LAB — URINE CULTURE, REFLEX

## 2022-05-05 NOTE — Progress Notes (Signed)
Assessment/Plan:   1.  Parkinsons Disease, diagnosed July, 2019 with symptoms 2 years prior  -Continue carbidopa/levodopa 25/100, 2 tablets at 5 AM, 1 tablet at 9 AM/1 PM/5 PM  -Continue carbidopa/levodopa 50/200 at bedtime   -We previously discussed pramipexole, and he really did not want to add it.  He came today on low-dose ropinirole, 0.25 mg, but really was only taking it a few times per week.  Discussed with patient that this would not be an effective dose for Parkinsons disease.  He also thought it would be helping his voice, and discussed that no medication would do that.  Discussed only voice therapy would help this.  We did discuss, however, increasing the ropinirole to 1 mg 3 times per day to make it effective for Parkinsons since he has so much rigidity on the right.  However, he declined doing that, stating that he really wanted to try to do exercise, which is completely lacking at this point.  -We discussed that it used to be thought that levodopa would increase risk of melanoma but now it is believed that Parkinsons itself likely increases risk of melanoma. he is to get regular skin checks.  -discussed with pt that Parkinsons Disease can cause hypophonia and slurred speech but doesn't cause SOB.  -Spent a good amount of time discussing the importance of safe, cardiovascular exercise.  Discussed rock steady boxing.  Patient pamphlet was given on this.   2.  REM behavior disorder  -This is commonly associated with PD and the patient is experiencing this.  We discussed that this can be very serious and even harmful.  We talked about medications as well as physical barriers to put in the bed (particularly soft bed rails, pillow barriers).  We talked about moving the night stand so that it is not so close to the side of the bed.  3.  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  We  have sent notes to nurse practitioner who is managing the blood pressure, but she reports she is happy with control.  There is no doubt his control of blood pressure is good (blood pressure was only 254 systolic), but with Parkinson's disease, generally we have to allow a little bit of permissive hypertension.  This will need to be monitored closely with time.  4.  OSAS  -PSG in June, 2022 with AHI 10.  Now on CPAP and following with pulm.  5.  Sialorrhea  -Patient not ready for Botox.  Subjective:   James Bush was seen today in follow up for Parkinsons disease.  My previous records were reviewed prior to todays visit as well as outside records available to me.  Patient with his wife who supplements the history.  No falls since last visit. Some dizziness (likely from BP meds) but that is better than in the past.  Following with pulm for CPAP and pulmonary appears to have taken over his general medical care.  They started him on requip 0.25 mg daily for what is described as "vocal fatigue."  Pt states that he thinks the medication was for drooling.  Pt is only using it occasionally as he thought it would help the drooling when in crowds.  He takes it max 2-3 days per week and hasn't noted any benefit with it.  He is on short term disability now with plans to retire beginning of year.  He is not  exercising.    Current prescribed movement disorder medications: carbidopa/levodopa 25/100, 2 tablets at 5 AM, 1 tablet at 9 AM/1 PM/5 PM  Carbidopa/levodopa 50/200 at bedtime   PREVIOUS MEDICATIONS: Carbidopa/levodopa 25/100 CR (I discontinued this when I first started seeing him to change him to the IR)  ALLERGIES:  No Known Allergies  CURRENT MEDICATIONS:  Outpatient Encounter Medications as of 05/07/2022  Medication Sig   albuterol (VENTOLIN HFA) 108 (90 Base) MCG/ACT inhaler Inhale 2 puffs into the lungs every 6 (six) hours as needed for wheezing or shortness of breath.   amLODipine (NORVASC) 5 MG  tablet TAKE 1 TABLET (5 MG TOTAL) BY MOUTH DAILY.   aspirin 81 MG tablet Take 81 mg by mouth daily.   losartan-hydrochlorothiazide (HYZAAR) 50-12.5 MG tablet TAKE 1 TABLET BY MOUTH EVERY DAY   methocarbamol (ROBAXIN) 500 MG tablet Take 1 tablet (500 mg total) by mouth every 8 (eight) hours as needed for muscle spasms.   Multiple Vitamin (MULTIVITAMIN WITH MINERALS) TABS tablet Take 1 tablet by mouth daily.   rOPINIRole (REQUIP) 0.25 MG tablet Take one tab a day for parkisnonism   sildenafil (VIAGRA) 100 MG tablet TAKE 0.5-1 TABLETS BY MOUTH DAILY AS NEEDED FOR ERECTILE DYSFUNCTION.   simvastatin (ZOCOR) 20 MG tablet TAKE 1 TABLET BY MOUTH EVERYDAY AT BEDTIME   [DISCONTINUED] carbidopa-levodopa (SINEMET CR) 50-200 MG tablet Take 1 tablet by mouth at bedtime.   [DISCONTINUED] carbidopa-levodopa (SINEMET IR) 25-100 MG tablet 2 tablets at 5am/ 1 tablet at 9am/1pm/5pm.   carbidopa-levodopa (SINEMET CR) 50-200 MG tablet Take 1 tablet by mouth at bedtime.   carbidopa-levodopa (SINEMET IR) 25-100 MG tablet 2 tablets at 5am/ 1 tablet at 9am/1pm/5pm.   No facility-administered encounter medications on file as of 05/07/2022.    Objective:   PHYSICAL EXAMINATION:    VITALS:   Vitals:   05/07/22 0855  BP: (!) 147/85  Pulse: 70  SpO2: 97%  Weight: 219 lb (99.3 kg)  Height: '5\' 10"'$  (1.778 m)       GEN:  The patient appears stated age and is in NAD. HEENT:  Normocephalic, atraumatic.  The mucous membranes are moist.    Neurological examination:  Orientation: The patient is alert and oriented x3. Cranial nerves: There is good facial symmetry with facial hypomimia. The speech is fluent and clear. Soft palate rises symmetrically and there is no tongue deviation. Hearing is intact to conversational tone. Sensation: Sensation is intact to light touch throughout Motor: Strength is at least antigravity x4.  Movement examination: Tone: There is  mild to moderate in the right upper extremity (same  as last several visits ).  Tone in the left upper extremity is good.  Abnormal movements: none even with distraction Coordination:  There is mild decremation with RAM's, with any form of RAMS, including alternating supination and pronation of the forearm, hand opening and closing, finger taps, heel taps and toe taps, on the right.  This is same as previous.  Some decreased toe taps on the L Gait and Station: The patient has no difficulty arising out of a deep-seated chair without the use of the hands.  He ambulates well in the hall, with decreased arm swing.  I have reviewed and interpreted the following labs independently    Chemistry      Component Value Date/Time   NA 140 03/28/2020 0922   K 4.3 03/28/2020 0922   CL 101 03/28/2020 0922   CO2 30 03/28/2020 0922   BUN 20 03/28/2020 1025  CREATININE 1.08 03/28/2020 0922      Component Value Date/Time   CALCIUM 9.1 03/28/2020 0922   ALKPHOS 44 03/28/2020 0922   AST 16 03/28/2020 0922   ALT <5 03/28/2020 0922   BILITOT 1.0 03/28/2020 0922       Lab Results  Component Value Date   WBC 6.3 03/28/2020   HGB 13.7 03/28/2020   HCT 41.9 03/28/2020   MCV 94.2 03/28/2020   PLT 219 03/28/2020    Lab Results  Component Value Date   TSH 1.041 03/28/2020     Total time spent on today's visit was 30 minutes, including both face-to-face time and nonface-to-face time.  Time included that spent on review of records (prior notes available to me/labs/imaging if pertinent), discussing treatment and goals, answering patient's questions and coordinating care.  Cc:  Jonetta Osgood, NP

## 2022-05-07 ENCOUNTER — Encounter: Payer: Self-pay | Admitting: Neurology

## 2022-05-07 ENCOUNTER — Ambulatory Visit: Payer: No Typology Code available for payment source | Admitting: Neurology

## 2022-05-07 VITALS — BP 147/85 | HR 70 | Ht 70.0 in | Wt 219.0 lb

## 2022-05-07 DIAGNOSIS — G4733 Obstructive sleep apnea (adult) (pediatric): Secondary | ICD-10-CM | POA: Diagnosis not present

## 2022-05-07 DIAGNOSIS — K117 Disturbances of salivary secretion: Secondary | ICD-10-CM | POA: Diagnosis not present

## 2022-05-07 DIAGNOSIS — G20 Parkinson's disease: Secondary | ICD-10-CM | POA: Diagnosis not present

## 2022-05-07 MED ORDER — CARBIDOPA-LEVODOPA 25-100 MG PO TABS: ORAL_TABLET | ORAL | 1 refills | Status: DC

## 2022-05-07 MED ORDER — CARBIDOPA-LEVODOPA ER 50-200 MG PO TBCR
1.0000 | EXTENDED_RELEASE_TABLET | Freq: Every day | ORAL | 1 refills | Status: DC
Start: 2022-05-07 — End: 2022-11-11

## 2022-05-07 NOTE — Patient Instructions (Signed)
Local and Online Resources for Power over Parkinson's Group September 2023  LOCAL [place] PARKINSON'S GROUPS  Power over Parkinson's Group:   Power Over Parkinson's Patient Education Group will be Wednesday, September 13th-*Hybrid meting*- in person at [facility] location and via [facility] at 2 pm.   Upcoming Power over [employer] Meetings:  2nd Wednesdays of the month at 2 pm:  September 13th, October 11th, November 8th Contact Amy Marriott at [email or web address] if interested in participating in this group Parkinson's Care Partners Group:    3rd Mondays, Contact Misty Paladino Atypical Parkinsonian Patient Group:   4th Wednesdays, Contact Misty Paladino If you are interested in participating in these groups with Misty, please contact her directly for how to join those meetings.  Her contact information is [email or web address].    LOCAL EVENTS AND NEW OFFERINGS New PWR! Moves [employer] Instructor-Led Classes offering at [employer]!  Wednesdays 1-2 pm.   Contact Vonna Kotyk at  [email or web address] or Caron Presume at [place], [email or web address] Dance for Parkinson 's classes will be on Tuesdays 9:30am-10:30am starting October 3-December 12 with a break the week of November 21 . Located in the December 04 which is in the first floor of the [employer]  ([place] for Parkinson's will be held on 2nd and 4th Mondays at 11:00am . First class will start  September 25th.  Located at the [place] ([place].) Through support from the [place] and Drumming for Parkinson's classes are free for both patients and caregivers.  Contact Misty Taylor-Paladino for more details about registering.  [place]:  [email or web address] PD Health at Home continues:  Mindfulness Mondays, Wellness  Wednesdays, Fitness Fridays  Upcoming Education:   Navigating Nutrition with PD.  Wednesday, Sept. 6th 1:00-2:00 pm Understanding Mind and Memory.  Wednesday, Sept. 20th 1:00-2:00 pm  Expert Briefing:    Parkinson's Disease and the Bladder.  Wednesday, Sept. 13th 1:00-2:00 pm Parkinson's and the Gut-Brain Connection.  Wednesday, Oct. 11th 1:00-2:00 pm Register for expert briefings (webinars) at 02-15-1978 Please check out their website to sign up for emails and see their full online offerings   [place]:  [email or web address]  Third Thursday Webinars:  On the third Thursday of every month at 12 p.m. ET, join our free live webinars to learn about various aspects of living with Parkinson's disease and our work to speed medical breakthroughs. Upcoming Webinar:  Stay tuned Check out additional information on their website to see their full online offerings  Tuesday:  [email or web address] Upcoming Webinar:   Stay tuned Webinar Series:  Living with Parkinson's Meetup.   Third Thursdays each month, 3 pm Care Partner Monthly Meetup.  With 10-15-1987 [place].  First Tuesday of each month, 2 pm Check out additional information to Live Well Today on their website  Parkinson and Movement Disorders (PMD) Alliance:  www.[email or web address] [employer] Online:  Online Education Events Sign up for emails, which are sent weekly to give you updates on programming and online offerings  Parkinson's Association of the [place]:  www.[email or web address] Information on online support groups, education events, and online exercises including Yoga, Parkinson's exercises and more-LOTS of information on links to PD resources and online events Virtual Support Group through Parkinson's Association of the [place]; next one is scheduled for Wednesday, October 4th at 2 pm. (No September meeting  due to the symposium.  These are typically scheduled for the 1st Wednesday  of the month at 2 pm).  Visit website for details. Register for "Caring for Parkinson's-Caring for You", 9th Annual Symposium.  In-person event in [place].  September 9th.  To register:  [email or web address]/[email or web address]_aid=[email or web address] MOVEMENT AND EXERCISE OPPORTUNITIES PWR! Moves Classes at [place].  Wednesdays 10 and 11 am.   Contact Amy Marriott, PT [email or web address] if interested. NEW PWR! Moves Class offerings at [employer].  Wednesdays 1-2 pm.  Contact Vonna Kotyk at  [email or web address] or Caron Presume at [place],  [email or web address] Parkinson's Wellness Recovery (PWR! Moves)  www.[email or web address] Info on the PWR! Virtual Experience:  You will have access to our expertise through self-assessment, guided plans that start with the PD-specific fundamentals, educational content, tips, Q&A with an expert, and a growing [place] of PD-specific pre-recorded and live exercise classes of varying types and intensity - both physical and cognitive! If that is not enough, we offer 1:1 wellness consultations (in-person or virtual) to personalize your PWR! Art therapist.  [place] Fridays:  As part of the PD Health @ Home program, this free video series focuses each week on one aspect of fitness designed to support people living with Parkinson's.  These weekly videos highlight the [place] recent fitness guidelines for people with Parkinson's disease. [address] Dance for PD website is offering free, live-stream classes throughout the week, as well as links to Modem[employer].si of classes:  [email or web address]/ Virtual dance and Pilates for Parkinson's classes: Click on the Community Tab> Parkinson's Movement Initiative Tab.  To register for classes and for more  information, visit www.[employer] and click the "community" tab.  [employer] Parkinson's Cycling Classes  [employer]:  Thursdays @ Noon-Live classes at 10-15-1987 ([employer] at [email or web address] or [phone]) [employer]: Virtual Classes Mondays and Thursdays 10-15-1987 classes Tuesday, Wednesday and Thursday (contact [place] at [email or web address]  or [phone]) [place] Varied levels of classes are offered Tuesdays and Thursdays at [facility].  Stretching with [facility] weekly class is also offered for people with Parkinson's To observe a class or for more information, call [phone] or email [phone] at [email or web address] ADDITIONAL SUPPORT AND RESOURCES Well[employer]:Online Caregiver Education Opportunities:  www.well-[email or web address]/caregiver-education/caregiver-support-group.  You may also contact Hezzie Bump at [email or web address] or [phone].    Coping with Difficult Caregiver Emotions.  Wednesday, September 20th, 10:30 am-12.  The [facility], [facility] Collective Navigating the Maze of Senior Care Options.  Thursday, September 28th, 4-5:15 pm.  The [facility]. Well-Spring Navigator:  02-[birth year]-1997 program, a free service to help individuals and families through the journey of determining care for older adults.  The "Navigator" is a [facility], [employer], who will speak with a prospective client and/or loved ones to provide an assessment of the situation and a set of recommendations for a personalized care plan -- all free of charge, and whether [employer] offers the needed service or not. If the need is not a service we provide, we are well-connected with reputable programs in town that we can refer you to.  www.well-springsolutions.org or to speak with the Navigator, call [phone].

## 2022-06-16 DIAGNOSIS — Z0279 Encounter for issue of other medical certificate: Secondary | ICD-10-CM

## 2022-06-25 ENCOUNTER — Other Ambulatory Visit: Payer: Self-pay | Admitting: *Deleted

## 2022-06-25 ENCOUNTER — Other Ambulatory Visit: Payer: Self-pay | Admitting: Nurse Practitioner

## 2022-06-25 ENCOUNTER — Other Ambulatory Visit: Payer: Self-pay | Admitting: Internal Medicine

## 2022-06-25 DIAGNOSIS — C61 Malignant neoplasm of prostate: Secondary | ICD-10-CM

## 2022-06-25 DIAGNOSIS — R498 Other voice and resonance disorders: Secondary | ICD-10-CM

## 2022-06-25 DIAGNOSIS — I10 Essential (primary) hypertension: Secondary | ICD-10-CM

## 2022-06-26 ENCOUNTER — Other Ambulatory Visit: Payer: No Typology Code available for payment source

## 2022-06-26 DIAGNOSIS — C61 Malignant neoplasm of prostate: Secondary | ICD-10-CM

## 2022-06-27 LAB — PSA: Prostate Specific Ag, Serum: 11.2 ng/mL — ABNORMAL HIGH (ref 0.0–4.0)

## 2022-07-01 ENCOUNTER — Ambulatory Visit: Payer: No Typology Code available for payment source | Admitting: Urology

## 2022-07-02 ENCOUNTER — Encounter: Payer: Self-pay | Admitting: Urology

## 2022-07-02 ENCOUNTER — Ambulatory Visit: Payer: No Typology Code available for payment source | Admitting: Urology

## 2022-07-02 VITALS — BP 145/91 | HR 85 | Ht 70.0 in | Wt 219.0 lb

## 2022-07-02 DIAGNOSIS — N3281 Overactive bladder: Secondary | ICD-10-CM | POA: Diagnosis not present

## 2022-07-02 DIAGNOSIS — C61 Malignant neoplasm of prostate: Secondary | ICD-10-CM | POA: Diagnosis not present

## 2022-07-02 DIAGNOSIS — N529 Male erectile dysfunction, unspecified: Secondary | ICD-10-CM

## 2022-07-02 LAB — BLADDER SCAN AMB NON-IMAGING: Scan Result: 17

## 2022-07-02 MED ORDER — TADALAFIL 20 MG PO TABS: 20.0000 mg | ORAL_TABLET | Freq: Every day | ORAL | 3 refills | Status: DC | PRN

## 2022-07-02 NOTE — Progress Notes (Signed)
   07/02/2022 8:43 AM   James Bush [birth year] [id number]  Reason for visit: Follow up low risk prostate cancer, lower urinary tract symptoms, ED   HPI: He is a 61 year old African-American male with Parkinson's disease who was diagnosed with very low risk prostate cancer in March 2017.  He does have a family history of non-lethal prostate cancer in his uncle.  PSA at time of initial diagnosis was 6.25, and biopsy showed 1/12 cores positive for Gleason score 3+3= 6 disease.  Confirmatory biopsy in October 2017 showed 2/12 cores of 3+3=6 disease, with max core involvement of 33%.  Prostate volume was 53 g, with PSA density of 0.13.   PSA had been stable ranging from 6.7-8.7 from 2019 through 2022, but most recent value from 06/26/2022 increased to 11.2.  We discussed the variability of PSA, and the need to repeat this lab in the next month.  Possible etiologies of false elevation discussed at length.  If PSA remains elevated over 11, would recommend repeat biopsy, as his last biopsy was in 2017.   He deferred DRE today and last year, he has had an asymmetric prostate previously.   He has a history of mild urinary symptoms of some frequency, and occasional urgency.  We previously had discussed cutting back on diet sodas which have improved his symptoms significantly.  He really denies any significant urinary complaints today, and has nocturia 0-1 time overnight.  We reviewed behavioral strategies, as well as the impact of Parkinson's on bladder symptoms and overactive bladder.  Could consider Myrbetriq in the future if worsening OAB symptoms.   He was previously on Viagra 50 to 100 mg on demand for ED which has decreased in effectiveness.  He is interested in trying another medication, and I recommended Cialis 20 mg on demand and discontinue the Viagra.  -Repeat PSA lab test 1 month, if remains elevated >11 will schedule prostate biopsy -Trial of Cialis 20 mg on demand for ED -Behavioral  strategies discussed regarding [place], MD  [place] [address] [place], [place] [address] [phone]

## 2022-07-06 ENCOUNTER — Telehealth: Payer: Self-pay | Admitting: Nurse Practitioner

## 2022-07-06 NOTE — Telephone Encounter (Signed)
Received disability forms from [place]. Gave to Alyssa to complete-Toni

## 2022-07-09 ENCOUNTER — Encounter: Payer: Self-pay | Admitting: Nurse Practitioner

## 2022-07-09 ENCOUNTER — Ambulatory Visit: Payer: No Typology Code available for payment source | Admitting: Nurse Practitioner

## 2022-07-09 VITALS — BP 128/80 | HR 88 | Temp 97.0°F | Resp 16 | Ht 70.0 in | Wt 222.8 lb

## 2022-07-09 DIAGNOSIS — G20B2 Parkinson's disease with dyskinesia, with fluctuations: Secondary | ICD-10-CM

## 2022-07-09 DIAGNOSIS — R0602 Shortness of breath: Secondary | ICD-10-CM | POA: Diagnosis not present

## 2022-07-09 DIAGNOSIS — Z760 Encounter for issue of repeat prescription: Secondary | ICD-10-CM

## 2022-07-09 DIAGNOSIS — I10 Essential (primary) hypertension: Secondary | ICD-10-CM | POA: Diagnosis not present

## 2022-07-09 DIAGNOSIS — E782 Mixed hyperlipidemia: Secondary | ICD-10-CM

## 2022-07-09 MED ORDER — LOSARTAN POTASSIUM-HCTZ 50-12.5 MG PO TABS: 1.0000 | ORAL_TABLET | Freq: Every day | ORAL | 1 refills | Status: DC

## 2022-07-09 MED ORDER — ALBUTEROL SULFATE HFA 108 (90 BASE) MCG/ACT IN AERS: 2.0000 | INHALATION_SPRAY | Freq: Four times a day (QID) | RESPIRATORY_TRACT | 5 refills | Status: DC | PRN

## 2022-07-09 NOTE — Progress Notes (Signed)
[facility] [place], [place] [address]  Internal MEDICINE  Office Visit Note  Patient Name: James Bush  [id number]  [id number]  Date of Service: 07/09/2022  Chief Complaint  Patient presents with  . Follow-up  . Hyperlipidemia  . Hypertension    HPI James Bush presents for a follow up visit for hypertension, hyperlipidemia, and parkinson's disease.  Refills, LTD paperwork, recheck BP Hypertension -- controlled with current meds, needs refills High cholesterol -- stopped taking simvastatin. Did not get ordered labs done but job had annual screening and cholesterol was checked, will bring results to scan into chart.  STD is transitioning to LTD with job -- increased risk of falls due to parkinson's disease. Paperwork is due by 07/14/22, but no later than 07/25/22.  Wife is worried he is depressed -- denies depressed mood. Has never been a man of many words and prefers to be to himself for many leisure activities. Reports that his meds make him sleepy, low energy and fatigued sometimes.      Current Medication: Outpatient Encounter Medications as of 07/09/2022  Medication Sig  . amLODipine (NORVASC) 5 MG tablet TAKE 1 TABLET (5 MG TOTAL) BY MOUTH DAILY.  Marland Kitchen aspirin 81 MG tablet Take 81 mg by mouth daily.  . carbidopa-levodopa (SINEMET CR) 50-200 MG tablet Take 1 tablet by mouth at bedtime.  . carbidopa-levodopa (SINEMET IR) 25-100 MG tablet 2 tablets at 5am/ 1 tablet at 9am/1pm/5pm.  . Multiple Vitamin (MULTIVITAMIN WITH MINERALS) TABS tablet Take 1 tablet by mouth daily.  Marland Kitchen rOPINIRole (REQUIP) 0.25 MG tablet TAKE ONE TAB A DAY FOR PARKISNONISM  . simvastatin (ZOCOR) 20 MG tablet TAKE 1 TABLET BY MOUTH EVERYDAY AT BEDTIME  . tadalafil (CIALIS) 20 MG tablet Take 1 tablet (20 mg total) by mouth daily as needed for erectile dysfunction.  . [DISCONTINUED] albuterol (VENTOLIN HFA) 108 (90 Base) MCG/ACT inhaler Inhale 2 puffs into the lungs every 6 (six) hours as  needed for wheezing or shortness of breath.  . [DISCONTINUED] losartan-hydrochlorothiazide (HYZAAR) 50-12.5 MG tablet TAKE 1 TABLET BY MOUTH EVERY DAY  . [DISCONTINUED] methocarbamol (ROBAXIN) 500 MG tablet Take 1 tablet (500 mg total) by mouth every 8 (eight) hours as needed for muscle spasms.  Marland Kitchen albuterol (VENTOLIN HFA) 108 (90 Base) MCG/ACT inhaler Inhale 2 puffs into the lungs every 6 (six) hours as needed for wheezing or shortness of breath.  . losartan-hydrochlorothiazide (HYZAAR) 50-12.5 MG tablet Take 1 tablet by mouth daily.   No facility-administered encounter medications on file as of 07/09/2022.    Surgical History: Past Surgical History:  Procedure Laterality Date  . CARDIAC ELECTROPHYSIOLOGY STUDY AND ABLATION    . COLONOSCOPY N/A 03/20/2021   Procedure: COLONOSCOPY;  Surgeon: James Bellows, MD;  Location: [facility] ENDOSCOPY;  Service: Gastroenterology;  Laterality: N/A;  . EYE SURGERY     strabismus as child  . KNEE ARTHROPLASTY Right     Medical History: Past Medical History:  Diagnosis Date  . Asthma   . Heart murmur   . Hyperlipidemia   . Hypertension   . Parkinson disease     Family History: Family History  Problem Relation Age of Onset  . COPD Mother   . Multiple sclerosis Son   . Healthy Daughter   . Healthy Daughter   . Hematuria Neg Hx   . Kidney cancer Neg Hx   . Prostate cancer Neg Hx     Social History   Socioeconomic History  . Marital status: Married  Spouse name: Not on file  . Number of children: Not on file  . Years of education: Not on file  . Highest education level: Not on file  Occupational History  . Not on file  Tobacco Use  . Smoking status: Never    Passive exposure: Never  . Smokeless tobacco: Never  Vaping Use  . Vaping Use: Never used  Substance and Sexual Activity  . Alcohol use: Yes    Alcohol/week: 0.0 standard drinks of alcohol    Comment: 1 beer/month  . Drug use: No  . Sexual activity: Not on file  Other Topics  Concern  . Not on file  Social History Narrative   Right Handed    Lives in a one story home   Social Determinants of Health   Financial Resource Strain: Not on file  Food Insecurity: Not on file  Transportation Needs: Not on file  Physical Activity: Not on file  Stress: Not on file  Social Connections: Not on file  Intimate Partner Violence: Not on file      Review of Systems  Constitutional:  Positive for fatigue. Negative for appetite change and chills.  HENT: Negative.    Respiratory:  Negative for cough, chest tightness, shortness of breath (reports SOB is better but has albuterol inhaler if needed.) and wheezing.   Cardiovascular: Negative.  Negative for chest pain and palpitations.  Musculoskeletal:  Positive for arthralgias and gait problem.  Neurological:  Positive for tremors (meds are helping to slow progression).    Vital Signs: BP 128/80   Pulse 88   Temp (!) 97 F (36.1 C)   Resp 16   Ht '5\' 10"'$  (1.778 m)   Wt 222 lb 12.8 oz (101.1 kg)   SpO2 97%   BMI 31.97 kg/m    Physical Exam Vitals reviewed.  Constitutional:      General: He is not in acute distress.    Appearance: Normal appearance. He is obese.  HENT:     Head: Normocephalic and atraumatic.  Eyes:     Pupils: Pupils are equal, round, and reactive to light.  Cardiovascular:     Rate and Rhythm: Normal rate and regular rhythm.  Pulmonary:     Effort: Pulmonary effort is normal. No respiratory distress.  Neurological:     Mental Status: He is alert and oriented to person, place, and time.     Gait: Gait abnormal.  Psychiatric:        Mood and Affect: Mood normal. Affect is flat (sometimes, or masked).        Behavior: Behavior normal.       Assessment/Plan: 1. Essential hypertension Stable, continue medications as prescribed  2. Parkinson's disease with dyskinesia and fluctuating manifestations Followed by neurology. Current medications are helping --carbidopa-levodopa and  ropinirole  3. Mixed hyperlipidemia Will bring recent lab results from work to be scanned into chart, will discuss statin therapy  4. Shortness of breath Improved, has albuterol inhaler if needed  5. Medication refill - albuterol (VENTOLIN HFA) 108 (90 Base) MCG/ACT inhaler; Inhale 2 puffs into the lungs every 6 (six) hours as needed for wheezing or shortness of breath.  Dispense: 18 g; Refill: 5 - losartan-hydrochlorothiazide (HYZAAR) 50-12.5 MG tablet; Take 1 tablet by mouth daily.  Dispense: 90 tablet; Refill: 1   General Counseling: Brandol verbalizes understanding of the findings of todays visit and agrees with plan of treatment. I have discussed any further diagnostic evaluation that may be needed or ordered today. We  also reviewed his medications today. he has been encouraged to call the office with any questions or concerns that should arise related to todays visit.    No orders of the defined types were placed in this encounter.   Meds ordered this encounter  Medications  . albuterol (VENTOLIN HFA) 108 (90 Base) MCG/ACT inhaler    Sig: Inhale 2 puffs into the lungs every 6 (six) hours as needed for wheezing or shortness of breath.    Dispense:  18 g    Refill:  5  . losartan-hydrochlorothiazide (HYZAAR) 50-12.5 MG tablet    Sig: Take 1 tablet by mouth daily.    Dispense:  90 tablet    Refill:  1    Return in about 3 months (around 10/09/2022) for F/U, Aymee Fomby PCP.   Total time spent:30 Minutes Time spent includes review of chart, medications, test results, and follow up plan with the patient.   [place] Controlled Substance Database was reviewed by me.  This patient was seen by Jonetta Osgood, FNP-C in collaboration with Dr. Clayborn Bigness as a part of collaborative care agreement.   Jeanell Mangan R. Valetta Fuller, MSN, FNP-C Internal medicine

## 2022-07-30 ENCOUNTER — Other Ambulatory Visit: Payer: No Typology Code available for payment source

## 2022-07-30 DIAGNOSIS — C61 Malignant neoplasm of prostate: Secondary | ICD-10-CM

## 2022-07-30 DIAGNOSIS — N3281 Overactive bladder: Secondary | ICD-10-CM

## 2022-07-31 LAB — PSA: Prostate Specific Ag, Serum: 10.1 ng/mL — ABNORMAL HIGH (ref 0.0–4.0)

## 2022-08-10 ENCOUNTER — Telehealth: Payer: Self-pay

## 2022-08-10 ENCOUNTER — Telehealth: Payer: Self-pay | Admitting: Nurse Practitioner

## 2022-08-10 DIAGNOSIS — C61 Malignant neoplasm of prostate: Secondary | ICD-10-CM

## 2022-08-10 NOTE — Telephone Encounter (Signed)
Called pt informed him of the information below. Appt scheduled, lab ordered, lab appt scheduled. Pt voiced understanding.

## 2022-08-10 NOTE — Telephone Encounter (Signed)
-----   Message from Billey Co, MD sent at 08/06/2022  8:07 AM EST ----- Good news, PSA down slightly to 10.1 from 11.2, recommend RTC 9mowith PSA prior  BNickolas Madrid MD 08/06/2022

## 2022-08-10 NOTE — Telephone Encounter (Signed)
[employer] forms and 02/12/22-07/09/22 office notes faxed back to [facility]; [phone]

## 2022-08-18 ENCOUNTER — Telehealth: Payer: Self-pay | Admitting: Nurse Practitioner

## 2022-08-18 NOTE — Telephone Encounter (Signed)
Lvm to move 08/19/22 appt up to 1:00-Toni

## 2022-08-19 ENCOUNTER — Ambulatory Visit: Payer: Self-pay

## 2022-09-18 ENCOUNTER — Encounter: Payer: Self-pay | Admitting: Urology

## 2022-10-09 ENCOUNTER — Ambulatory Visit: Payer: 59 | Admitting: Nurse Practitioner

## 2022-10-09 ENCOUNTER — Telehealth: Payer: Self-pay | Admitting: Nurse Practitioner

## 2022-10-09 ENCOUNTER — Encounter: Payer: Self-pay | Admitting: Nurse Practitioner

## 2022-10-09 VITALS — BP 132/85 | HR 89 | Temp 98.4°F | Resp 16 | Ht 70.0 in | Wt 226.2 lb

## 2022-10-09 DIAGNOSIS — E782 Mixed hyperlipidemia: Secondary | ICD-10-CM | POA: Diagnosis not present

## 2022-10-09 DIAGNOSIS — I10 Essential (primary) hypertension: Secondary | ICD-10-CM | POA: Diagnosis not present

## 2022-10-09 DIAGNOSIS — Z0000 Encounter for general adult medical examination without abnormal findings: Secondary | ICD-10-CM

## 2022-10-09 DIAGNOSIS — G20B2 Parkinson's disease with dyskinesia, with fluctuations: Secondary | ICD-10-CM

## 2022-10-09 MED ORDER — AMLODIPINE BESYLATE 5 MG PO TABS: 5.0000 mg | ORAL_TABLET | Freq: Every day | ORAL | 1 refills | Status: DC

## 2022-10-09 MED ORDER — LOSARTAN POTASSIUM-HCTZ 50-12.5 MG PO TABS: 1.0000 | ORAL_TABLET | Freq: Every day | ORAL | 1 refills | Status: DC

## 2022-10-09 MED ORDER — ALBUTEROL SULFATE HFA 108 (90 BASE) MCG/ACT IN AERS: 2.0000 | INHALATION_SPRAY | Freq: Four times a day (QID) | RESPIRATORY_TRACT | 5 refills | Status: DC | PRN

## 2022-10-09 NOTE — Progress Notes (Signed)
[facility] [place], [place] [address]  Internal MEDICINE  Office Visit Note  Patient Name: James Bush  [id number]  [id number]  Date of Service: 10/09/2022  Chief Complaint  Patient presents with   Follow-up   Hyperlipidemia   Hypertension    HPI James Bush presents for a follow-up visit for parkinsons, hypertension and hyperlipidemia.  Parkinsons -- sees neurology, takes carbidopa-levodopa. Neurology said to stop ropinirole for now.  Hypertension -- controlled on current medications High cholesterol -- was taking simvastatin but stopped. Has not had his cholesterol levels checked in a while. Has had 1 fall recently, tremors are minimal today, voice sound steady and at normal volume.  Needs repeat labs     Current Medication: Outpatient Encounter Medications as of 10/09/2022  Medication Sig   aspirin 81 MG tablet Take 81 mg by mouth daily.   carbidopa-levodopa (SINEMET CR) 50-200 MG tablet Take 1 tablet by mouth at bedtime.   carbidopa-levodopa (SINEMET IR) 25-100 MG tablet 2 tablets at 5am/ 1 tablet at 9am/1pm/5pm.   Multiple Vitamin (MULTIVITAMIN WITH MINERALS) TABS tablet Take 1 tablet by mouth daily.   simvastatin (ZOCOR) 20 MG tablet TAKE 1 TABLET BY MOUTH EVERYDAY AT BEDTIME   tadalafil (CIALIS) 20 MG tablet Take 1 tablet (20 mg total) by mouth daily as needed for erectile dysfunction.   [DISCONTINUED] albuterol (VENTOLIN HFA) 108 (90 Base) MCG/ACT inhaler Inhale 2 puffs into the lungs every 6 (six) hours as needed for wheezing or shortness of breath.   [DISCONTINUED] amLODipine (NORVASC) 5 MG tablet TAKE 1 TABLET (5 MG TOTAL) BY MOUTH DAILY.   [DISCONTINUED] losartan-hydrochlorothiazide (HYZAAR) 50-12.5 MG tablet Take 1 tablet by mouth daily.   [DISCONTINUED] rOPINIRole (REQUIP) 0.25 MG tablet TAKE ONE TAB A DAY FOR PARKISNONISM   albuterol (VENTOLIN HFA) 108 (90 Base) MCG/ACT inhaler Inhale 2 puffs into the lungs every 6 (six) hours as needed for  wheezing or shortness of breath.   amLODipine (NORVASC) 5 MG tablet Take 1 tablet (5 mg total) by mouth daily.   losartan-hydrochlorothiazide (HYZAAR) 50-12.5 MG tablet Take 1 tablet by mouth daily.   No facility-administered encounter medications on file as of 10/09/2022.    Surgical History: Past Surgical History:  Procedure Laterality Date   CARDIAC ELECTROPHYSIOLOGY STUDY AND ABLATION     COLONOSCOPY N/A 03/20/2021   Procedure: COLONOSCOPY;  Surgeon: Jonathon Bellows, MD;  Location[facility] ENDOSCOPY;  Service: Gastroenterology;  Laterality: N/A;   EYE SURGERY     strabismus as child   KNEE ARTHROPLASTY Right     Medical History: Past Medical History:  Diagnosis Date   Asthma    Heart murmur    Hyperlipidemia    Hypertension    Parkinson disease     Family History: Family History  Problem Relation Age of Onset   COPD Mother    Multiple sclerosis Son    Healthy Daughter    Healthy Daughter    Hematuria Neg Hx    Kidney cancer Neg Hx    Prostate cancer Neg Hx     Social History   Socioeconomic History   Marital status: Married    Spouse name: Not on file   Number of children: Not on file   Years of education: Not on file   Highest education level: Not on file  Occupational History   Not on file  Tobacco Use   Smoking status: Never    Passive exposure: Never   Smokeless tobacco: Never  Vaping Use  Vaping Use: Never used  Substance and Sexual Activity   Alcohol use: Yes    Alcohol/week: 0.0 standard drinks of alcohol    Comment: 1 beer/month   Drug use: No   Sexual activity: Not on file  Other Topics Concern   Not on file  Social History Narrative   Right Handed    Lives in a one story home   Social Determinants of Health   Financial Resource Strain: Not on file  Food Insecurity: Not on file  Transportation Needs: Not on file  Physical Activity: Not on file  Stress: Not on file  Social Connections: Not on file  Intimate Partner Violence: Not on file       Review of Systems  Constitutional:  Positive for fatigue. Negative for appetite change and chills.  HENT: Negative.    Respiratory:  Negative for cough, chest tightness, shortness of breath (reports SOB is better but has albuterol inhaler if needed.) and wheezing.   Cardiovascular: Negative.  Negative for chest pain and palpitations.  Musculoskeletal:  Positive for arthralgias and gait problem.  Neurological:  Positive for tremors (meds are helping to slow progression).    Vital Signs: BP 132/85   Pulse 89   Temp 98.4 F (36.9 C)   Resp 16   Ht 5' 10"$  (1.778 m)   Wt 226 lb 3.2 oz (102.6 kg)   SpO2 97%   BMI 32.46 kg/m    Physical Exam Vitals reviewed.  Constitutional:      General: He is not in acute distress.    Appearance: Normal appearance. He is obese.  HENT:     Head: Normocephalic and atraumatic.  Eyes:     Pupils: Pupils are equal, round, and reactive to light.  Cardiovascular:     Rate and Rhythm: Normal rate and regular rhythm.  Pulmonary:     Effort: Pulmonary effort is normal. No respiratory distress.  Neurological:     Mental Status: He is alert and oriented to person, place, and time.     Gait: Gait abnormal.  Psychiatric:        Mood and Affect: Mood normal. Affect is flat (sometimes, or masked).        Behavior: Behavior normal.        Assessment/Plan: 1. Essential hypertension Stable, continue amlodipine and losartan-HCTZ as prescribed.  - losartan-hydrochlorothiazide (HYZAAR) 50-12.5 MG tablet; Take 1 tablet by mouth daily.  Dispense: 90 tablet; Refill: 1 - amLODipine (NORVASC) 5 MG tablet; Take 1 tablet (5 mg total) by mouth daily.  Dispense: 90 tablet; Refill: 1  2. Parkinson's disease with dyskinesia and fluctuating manifestations Routine labs ordered. Followed by neurology - CMP14+EGFR - CBC with Differential/Platelet - Lipid Profile  3. Mixed hyperlipidemia Routine labs ordered - CMP14+EGFR - CBC with  Differential/Platelet - Lipid Profile  4. Routine health maintenance Routine labs ordered and albuterol refills.  - albuterol (VENTOLIN HFA) 108 (90 Base) MCG/ACT inhaler; Inhale 2 puffs into the lungs every 6 (six) hours as needed for wheezing or shortness of breath.  Dispense: 18 g; Refill: 5 - CMP14+EGFR - CBC with Differential/Platelet - Lipid Profile   General Counseling: Duwan verbalizes understanding of the findings of todays visit and agrees with plan of treatment. I have discussed any further diagnostic evaluation that may be needed or ordered today. We also reviewed his medications today. he has been encouraged to call the office with any questions or concerns that should arise related to todays visit.    Orders Placed  This Encounter  Procedures   CMP14+EGFR   CBC with Differential/Platelet   Lipid Profile    Meds ordered this encounter  Medications   losartan-hydrochlorothiazide (HYZAAR) 50-12.5 MG tablet    Sig: Take 1 tablet by mouth daily.    Dispense:  90 tablet    Refill:  1   amLODipine (NORVASC) 5 MG tablet    Sig: Take 1 tablet (5 mg total) by mouth daily.    Dispense:  90 tablet    Refill:  1   albuterol (VENTOLIN HFA) 108 (90 Base) MCG/ACT inhaler    Sig: Inhale 2 puffs into the lungs every 6 (six) hours as needed for wheezing or shortness of breath.    Dispense:  18 g    Refill:  5    Return in about 3 months (around 01/07/2023) for F/U, Zamaria Brazzle PCP.   Total time spent:30 Minutes Time spent includes review of chart, medications, test results, and follow up plan with the patient.   [place] Controlled Substance Database was reviewed by me.  This patient was seen by Jonetta Osgood, FNP-C in collaboration with Dr. Clayborn Bigness as a part of collaborative care agreement.   Jenella Craigie R. Valetta Fuller, MSN, FNP-C Internal medicine

## 2022-10-09 NOTE — Telephone Encounter (Signed)
MR from 01/18/21 to Present (85 pages) faxed to [facility]; [phone]

## 2022-10-15 DIAGNOSIS — Z0000 Encounter for general adult medical examination without abnormal findings: Secondary | ICD-10-CM | POA: Diagnosis not present

## 2022-10-15 DIAGNOSIS — G20B2 Parkinson's disease with dyskinesia, with fluctuations: Secondary | ICD-10-CM | POA: Diagnosis not present

## 2022-10-15 DIAGNOSIS — E782 Mixed hyperlipidemia: Secondary | ICD-10-CM | POA: Diagnosis not present

## 2022-10-16 LAB — CBC WITH DIFFERENTIAL/PLATELET
Basophils Absolute: 0 10*3/uL (ref 0.0–0.2)
Basos: 1 %
EOS (ABSOLUTE): 0.1 10*3/uL (ref 0.0–0.4)
Eos: 2 %
Hematocrit: 41.2 % (ref 37.5–51.0)
Hemoglobin: 13.9 g/dL (ref 13.0–17.7)
Immature Grans (Abs): 0 10*3/uL (ref 0.0–0.1)
Immature Granulocytes: 0 %
Lymphocytes Absolute: 2.1 10*3/uL (ref 0.7–3.1)
Lymphs: 33 %
MCH: 30.3 pg (ref 26.6–33.0)
MCHC: 33.7 g/dL (ref 31.5–35.7)
MCV: 90 fL (ref 79–97)
Monocytes Absolute: 0.6 10*3/uL (ref 0.1–0.9)
Monocytes: 9 %
Neutrophils Absolute: 3.5 10*3/uL (ref 1.4–7.0)
Neutrophils: 55 %
Platelets: 268 10*3/uL (ref 150–450)
RBC: 4.58 x10E6/uL (ref 4.14–5.80)
RDW: 12.8 % (ref 11.6–15.4)
WBC: 6.3 10*3/uL (ref 3.4–10.8)

## 2022-10-16 LAB — CMP14+EGFR
ALT: 4 IU/L (ref 0–44)
AST: 11 IU/L (ref 0–40)
Albumin/Globulin Ratio: 1.6 (ref 1.2–2.2)
Albumin: 4 g/dL (ref 3.9–4.9)
Alkaline Phosphatase: 63 IU/L (ref 44–121)
BUN/Creatinine Ratio: 17 (ref 10–24)
BUN: 16 mg/dL (ref 8–27)
Bilirubin Total: 0.4 mg/dL (ref 0.0–1.2)
CO2: 25 mmol/L (ref 20–29)
Calcium: 9.4 mg/dL (ref 8.6–10.2)
Chloride: 101 mmol/L (ref 96–106)
Creatinine, Ser: 0.95 mg/dL (ref 0.76–1.27)
Globulin, Total: 2.5 g/dL (ref 1.5–4.5)
Glucose: 105 mg/dL — ABNORMAL HIGH (ref 70–99)
Potassium: 4 mmol/L (ref 3.5–5.2)
Sodium: 141 mmol/L (ref 134–144)
Total Protein: 6.5 g/dL (ref 6.0–8.5)
eGFR: 90 mL/min/{1.73_m2} (ref >59)

## 2022-10-16 LAB — LIPID PANEL
Chol/HDL Ratio: 5.4 ratio — ABNORMAL HIGH (ref 0.0–5.0)
Cholesterol, Total: 247 mg/dL — ABNORMAL HIGH (ref 100–199)
HDL: 46 mg/dL (ref >39)
LDL Chol Calc (NIH): 180 mg/dL — ABNORMAL HIGH (ref 0–99)
Triglycerides: 115 mg/dL (ref 0–149)
VLDL Cholesterol Cal: 21 mg/dL (ref 5–40)

## 2022-10-17 ENCOUNTER — Encounter: Payer: Self-pay | Admitting: Nurse Practitioner

## 2022-10-22 ENCOUNTER — Encounter: Payer: Self-pay | Admitting: Nurse Practitioner

## 2022-10-22 ENCOUNTER — Ambulatory Visit (INDEPENDENT_AMBULATORY_CARE_PROVIDER_SITE_OTHER): Payer: 59 | Admitting: Nurse Practitioner

## 2022-10-22 VITALS — BP 110/62 | HR 94 | Temp 97.2°F | Resp 16 | Ht 70.0 in | Wt 226.4 lb

## 2022-10-22 DIAGNOSIS — R7301 Impaired fasting glucose: Secondary | ICD-10-CM | POA: Diagnosis not present

## 2022-10-22 DIAGNOSIS — G20B2 Parkinson's disease with dyskinesia, with fluctuations: Secondary | ICD-10-CM

## 2022-10-22 DIAGNOSIS — E782 Mixed hyperlipidemia: Secondary | ICD-10-CM

## 2022-10-22 MED ORDER — ROSUVASTATIN CALCIUM 10 MG PO TABS: 10.0000 mg | ORAL_TABLET | Freq: Every day | ORAL | 1 refills | Status: DC

## 2022-10-22 NOTE — Progress Notes (Signed)
[facility] [place], [place] [address]  Internal MEDICINE  Office Visit Note  Patient Name: James Bush  [id number]  [id number]  Date of Service: 10/22/2022  Chief Complaint  Patient presents with   Follow-up    Review labs    HPI James Bush presents for a follow-up visit to review lab results Elevated cholesterol -- was not taking simvastatin --LDL 180 Elevated glucose 105 on labs  Kidneys and liver -- normal function per labs    Current Medication: Outpatient Encounter Medications as of 10/22/2022  Medication Sig   albuterol (VENTOLIN HFA) 108 (90 Base) MCG/ACT inhaler Inhale 2 puffs into the lungs every 6 (six) hours as needed for wheezing or shortness of breath.   amLODipine (NORVASC) 5 MG tablet Take 1 tablet (5 mg total) by mouth daily.   aspirin 81 MG tablet Take 81 mg by mouth daily.   carbidopa-levodopa (SINEMET CR) 50-200 MG tablet Take 1 tablet by mouth at bedtime.   carbidopa-levodopa (SINEMET IR) 25-100 MG tablet 2 tablets at 5am/ 1 tablet at 9am/1pm/5pm.   losartan-hydrochlorothiazide (HYZAAR) 50-12.5 MG tablet Take 1 tablet by mouth daily.   Multiple Vitamin (MULTIVITAMIN WITH MINERALS) TABS tablet Take 1 tablet by mouth daily.   rosuvastatin (CRESTOR) 10 MG tablet Take 1 tablet (10 mg total) by mouth daily.   tadalafil (CIALIS) 20 MG tablet Take 1 tablet (20 mg total) by mouth daily as needed for erectile dysfunction.   [DISCONTINUED] simvastatin (ZOCOR) 20 MG tablet TAKE 1 TABLET BY MOUTH EVERYDAY AT BEDTIME   No facility-administered encounter medications on file as of 10/22/2022.    Surgical History: Past Surgical History:  Procedure Laterality Date   CARDIAC ELECTROPHYSIOLOGY STUDY AND ABLATION     COLONOSCOPY N/A 03/20/2021   Procedure: COLONOSCOPY;  Surgeon: Jonathon Bellows, MD;  Location: [facility] ENDOSCOPY;  Service: Gastroenterology;  Laterality: N/A;   EYE SURGERY     strabismus as child   KNEE ARTHROPLASTY Right     Medical  History: Past Medical History:  Diagnosis Date   Asthma    Heart murmur    Hyperlipidemia    Hypertension    Parkinson disease     Family History: Family History  Problem Relation Age of Onset   COPD Mother    Multiple sclerosis Son    Healthy Daughter    Healthy Daughter    Hematuria Neg Hx    Kidney cancer Neg Hx    Prostate cancer Neg Hx     Social History   Socioeconomic History   Marital status: Married    Spouse name: Not on file   Number of children: Not on file   Years of education: Not on file   Highest education level: Not on file  Occupational History   Not on file  Tobacco Use   Smoking status: Never    Passive exposure: Never   Smokeless tobacco: Never  Vaping Use   Vaping Use: Never used  Substance and Sexual Activity   Alcohol use: Yes    Alcohol/week: 0.0 standard drinks of alcohol    Comment: 1 beer/month   Drug use: No   Sexual activity: Not on file  Other Topics Concern   Not on file  Social History Narrative   Right Handed    Lives in a one story home   Social Determinants of Health   Financial Resource Strain: Not on file  Food Insecurity: Not on file  Transportation Needs: Not on file  Physical  Activity: Not on file  Stress: Not on file  Social Connections: Not on file  Intimate Partner Violence: Not on file      Review of Systems  Constitutional:  Negative for chills, fatigue and unexpected weight change.  HENT:  Negative for congestion, rhinorrhea, sneezing and sore throat.   Eyes:  Negative for redness.  Respiratory: Negative.  Negative for cough, chest tightness and shortness of breath.   Cardiovascular: Negative.  Negative for chest pain and palpitations.  Gastrointestinal:  Negative for abdominal pain, constipation, diarrhea, nausea and vomiting.  Genitourinary:  Negative for dysuria and frequency.  Musculoskeletal:  Positive for gait problem. Negative for arthralgias, back pain, joint swelling and neck pain.  Skin:   Negative for rash.  Neurological:  Positive for tremors. Negative for numbness.  Hematological:  Negative for adenopathy. Does not bruise/bleed easily.  Psychiatric/Behavioral:  Negative for behavioral problems (Depression), sleep disturbance and suicidal ideas. The patient is not nervous/anxious.     Vital Signs: BP 110/62 Comment: 151/91  Pulse 94   Temp (!) 97.2 F (36.2 C)   Resp 16   Ht '5\' 10"'$  (1.778 m)   Wt 226 lb 6.4 oz (102.7 kg)   SpO2 96%   BMI 32.49 kg/m    Physical Exam Vitals reviewed.  Constitutional:      General: He is not in acute distress.    Appearance: Normal appearance. He is obese. He is not ill-appearing.  HENT:     Head: Normocephalic and atraumatic.  Eyes:     Pupils: Pupils are equal, round, and reactive to light.  Cardiovascular:     Rate and Rhythm: Normal rate and regular rhythm.  Pulmonary:     Effort: Pulmonary effort is normal. No respiratory distress.  Neurological:     Mental Status: He is alert and oriented to person, place, and time.     Gait: Gait abnormal.  Psychiatric:        Mood and Affect: Mood normal.        Behavior: Behavior normal.        Assessment/Plan: 1. Mixed hyperlipidemia Start rosuvastatin, discontinue simvastatin. Repeat lipid panel in 6 months. - rosuvastatin (CRESTOR) 10 MG tablet; Take 1 tablet (10 mg total) by mouth daily.  Dispense: 90 tablet; Refill: 1  2. Impaired fasting glucose Repeat in 6 months   3. Parkinson's disease with dyskinesia and fluctuating manifestations Stable, followed by neurology   General Counseling: James Bush understanding of the findings of todays visit and agrees with plan of treatment. I have discussed any further diagnostic evaluation that may be needed or ordered today. We also reviewed his medications today. he has been encouraged to call the office with any questions or concerns that should arise related to todays visit.    No orders of the defined types were  placed in this encounter.   Meds ordered this encounter  Medications   rosuvastatin (CRESTOR) 10 MG tablet    Sig: Take 1 tablet (10 mg total) by mouth daily.    Dispense:  90 tablet    Refill:  1    Return for previously scheduled, CPE, James Bush PCP in july.   Total time spent:30 Minutes Time spent includes review of chart, medications, test results, and follow up plan with the patient.   [place] Controlled Substance Database was reviewed by me.  This patient was seen by Jonetta Osgood, FNP-C in collaboration with Dr. Clayborn Bigness as a part of collaborative care agreement.   Delona Clasby R.  Valetta Fuller, MSN, FNP-C Internal medicine

## 2022-10-22 NOTE — Progress Notes (Signed)
Needs appointment to discuss lab results and possibly changing his cholesterol medication

## 2022-11-07 ENCOUNTER — Encounter: Payer: Self-pay | Admitting: Nurse Practitioner

## 2022-11-09 NOTE — Progress Notes (Unsigned)
Assessment/Plan:   1.  Parkinsons Disease, diagnosed July, 2019 with symptoms 2 years prior  -Continue carbidopa/levodopa 25/100, 2 tablets at 5 AM, 1 tablet at 9 AM/1 PM/5 PM  -Continue carbidopa/levodopa 50/200 at bedtime   -he declined DA therapy  -We discussed that it used to be thought that levodopa would increase risk of melanoma but now it is believed that Parkinsons itself likely increases risk of melanoma. he is to get regular skin checks.  -support group info and general Parkinsons Disease info and PF info given to pt/wife   2.  REM behavior disorder  -This is commonly associated with PD and the patient is experiencing this.  We discussed that this can be very serious and even harmful.  We talked about medications as well as physical barriers to put in the bed (particularly soft bed rails, pillow barriers).  We talked about moving the night stand so that it is not so close to the side of the bed.  3.  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  We have sent notes to nurse practitioner who is managing the blood pressure, but she reports she is happy with control.  I asked pt/wife to discuss with her again as he had syncopal episode since last visit.  While this was likely vagal (happened with coughing), its still a concern.    4.  OSAS  -PSG in June, 2022 with AHI 10.  Now on CPAP and following with pulm.  5.  Sialorrhea  -Patient not ready for Botox.  Subjective:   James Bush was seen today in follow up for Parkinsons disease.  My previous records were reviewed prior to todays visit as well as outside records available to me.  Patient with his wife who supplements the history.  Few falls since last visit.  He attributes these to when he was sick and coughing and then had "black out" spell.  He had no warning but came back to consciousness fast.  He saw NP after this and  let her know.  With the other "fall" he was asleep and fell out of the bed.  In regards to dizziness, he reports that he isn't dizzy during the day right now.  Last visit, the patient came to me on a as needed ropinirole from primary care.  He thought it would help voice strength.  We told him that would not, but offered to increase it to an effective dose for Parkinson's, which ultimately he had declined and wanted to try exercise, which was a reasonable approach.  He reports today that he doesn't note drooling but wife notes some.  He is not working any longer.    Current prescribed movement disorder medications: carbidopa/levodopa 25/100, 2 tablets at 5 AM, 1 tablet at 9 AM/1 PM/5 PM  Carbidopa/levodopa 50/200 at bedtime   PREVIOUS MEDICATIONS: Carbidopa/levodopa 25/100 CR (I discontinued this when I first started seeing him to change him to the IR)  ALLERGIES:  No Known Allergies  CURRENT MEDICATIONS:  Outpatient Encounter Medications as of 11/11/2022  Medication Sig   albuterol (VENTOLIN HFA) 108 (90 Base) MCG/ACT inhaler Inhale 2 puffs into the lungs every 6 (six) hours as needed for wheezing or shortness of breath.   amLODipine (NORVASC) 5 MG tablet Take 1 tablet (5 mg total) by mouth daily.   aspirin 81 MG tablet Take 81 mg by mouth daily.   carbidopa-levodopa (SINEMET  CR) 50-200 MG tablet Take 1 tablet by mouth at bedtime.   carbidopa-levodopa (SINEMET IR) 25-100 MG tablet 2 tablets at 5am/ 1 tablet at 9am/1pm/5pm.   losartan-hydrochlorothiazide (HYZAAR) 50-12.5 MG tablet Take 1 tablet by mouth daily.   Multiple Vitamin (MULTIVITAMIN WITH MINERALS) TABS tablet Take 1 tablet by mouth daily.   rosuvastatin (CRESTOR) 10 MG tablet Take 1 tablet (10 mg total) by mouth daily.   tadalafil (CIALIS) 20 MG tablet Take 1 tablet (20 mg total) by mouth daily as needed for erectile dysfunction.   No facility-administered encounter medications on file as of 11/11/2022.    Objective:   PHYSICAL  EXAMINATION:    VITALS:   Vitals:   11/11/22 0926  BP: 124/82  Pulse: 83  SpO2: 97%  Weight: 229 lb (103.9 kg)  Height: '5\' 10"'$  (1.778 m)    GEN:  The patient appears stated age and is in NAD. HEENT:  Normocephalic, atraumatic.  The mucous membranes are moist.    Neurological examination:  Orientation: The patient is alert and oriented x3. Cranial nerves: There is good facial symmetry with facial hypomimia. The speech is fluent and clear. Soft palate rises symmetrically and there is no tongue deviation. Hearing is intact to conversational tone. Sensation: Sensation is intact to light touch throughout Motor: Strength is at least antigravity x4.  Movement examination: Tone: There is  mild to moderate in the right upper extremity (same as last several visits ).  Tone in the left upper extremity is good.  Abnormal movements: none even with distraction Coordination:  There is mild decremation with RAM's, with any form of RAMS, including alternating supination and pronation of the forearm, hand opening and closing, finger taps, heel taps and toe taps, on the right.  This is same as previous.  Some decreased toe taps on the L Gait and Station: The patient has no difficulty arising out of a deep-seated chair without the use of the hands.  He ambulates well in the hall, with decreased arm swing.  I have reviewed and interpreted the following labs independently    Chemistry      Component Value Date/Time   NA 141 10/15/2022 1000   K 4.0 10/15/2022 1000   CL 101 10/15/2022 1000   CO2 25 10/15/2022 1000   BUN 16 10/15/2022 1000   CREATININE 0.95 10/15/2022 1000      Component Value Date/Time   CALCIUM 9.4 10/15/2022 1000   ALKPHOS 63 10/15/2022 1000   AST 11 10/15/2022 1000   ALT 4 10/15/2022 1000   BILITOT 0.4 10/15/2022 1000       Lab Results  Component Value Date   WBC 6.3 10/15/2022   HGB 13.9 10/15/2022   HCT 41.2 10/15/2022   MCV 90 10/15/2022   PLT 268 10/15/2022     Lab Results  Component Value Date   TSH 1.041 03/28/2020     Total time spent on today's visit was 32 minutes, including both face-to-face time and nonface-to-face time.  Time included that spent on review of records (prior notes available to me/labs/imaging if pertinent), discussing treatment and goals, answering patient's questions and coordinating care.  Cc:  Jonetta Osgood, NP

## 2022-11-11 ENCOUNTER — Encounter: Payer: Self-pay | Admitting: Neurology

## 2022-11-11 ENCOUNTER — Ambulatory Visit: Payer: 59 | Admitting: Neurology

## 2022-11-11 VITALS — BP 124/82 | HR 83 | Ht 70.0 in | Wt 229.0 lb

## 2022-11-11 DIAGNOSIS — G4752 REM sleep behavior disorder: Secondary | ICD-10-CM

## 2022-11-11 DIAGNOSIS — G20A1 Parkinson's disease without dyskinesia, without mention of fluctuations: Secondary | ICD-10-CM | POA: Diagnosis not present

## 2022-11-11 DIAGNOSIS — R55 Syncope and collapse: Secondary | ICD-10-CM

## 2022-11-11 MED ORDER — CARBIDOPA-LEVODOPA ER 50-200 MG PO TBCR: 1.0000 | EXTENDED_RELEASE_TABLET | Freq: Every day | ORAL | 1 refills | Status: DC

## 2022-11-11 MED ORDER — CARBIDOPA-LEVODOPA 25-100 MG PO TABS: ORAL_TABLET | ORAL | 1 refills | Status: DC

## 2022-11-11 NOTE — Patient Instructions (Addendum)
Get your skin checked.  You can call [facility].  Parkinsons Disease increases your risk for melanoma EXERCISE!! Talk to your PCP about your 3 Blood pressure medications.  Parkinsons Disease decreases BP.  You already passed out one time and I worry you could pass out more if we don't taper them some 4.  Good to see you today! 5.  You can email [email or web address] regarding support groups, including the caregiver support group   Local and Online Resources for Power over Parkinson's Group  March 2024   LOCAL [place] PARKINSON'S GROUPS   Power over Parkinson's Group:    Power Over Parkinson's Patient Education Group will be Wednesday, March 13th-*Hybrid meting*- in person at [facility] location and via [facility], 2:00-3:00 pm.   Starting in November 2023, Power over [employer] and Care Partner Groups will meet together, with plans for separate break out session for caregivers (*this will be evolving over the next few months) Upcoming Power over Parkinson's Meetings/Care Partner Support:  2nd Wednesdays of the month at 2 pm:  March 13th, April 10th [place] at [email or web address] if interested in participating in this group    [place] OFFERINGS  Let's Try Pickleball-$25 for 6 weeks of Pickleball, starting February 2nd.  Contact Corwin Levins for more details.  [email or web address] NEW:  Parkinson's Social Game Night.  First Thursday of each month, 2:00-4:00 pm.  *Next date is MARCH 7th*.  [place], [employer].  Contact [email or web address] if interested. Parkinson's CarePartner Group for Men is in the works, if interested email [email or web address] ACT FITNESS Chair Yoga classes "Train and Gain", Fridays 10 am, ACT Fitness.  Contact Gina at [phone].  PWR! Moves [employer] Instructor-Led Classes offering at [employer]!  TUESDAYS and Wednesdays 1-2 pm.   Contact  Vonna Kotyk at  [email or web address]  or [phone] (Tuesday classes are modified for chair and standing only) Drumming for Parkinson's will be held on 2nd and 4th Mondays at 11:00 am.   Located at the [place] ([place]  Contact Doylene Canning at [email or web address] or [phone]  Dance for Parkinson 's classes will be on Tuesdays 10-11 am starting in February. Located in the [employer] , in the first floor of the [employer] ([place] To register:  [email or web address] or [phone] [facility] Class, Mondays at 11 am.  Call [phone] for details Moving [place].  Saturday, May 4th, 10 am start.  Register at [employer].[place]:  [email or web address]  PD Health at Home continues:  Mindfulness Mondays, Wellness Wednesdays, Fitness Fridays  (PWR! Moves as part of Fitness Fridays March 22nd, 1-1:45 pm) Upcoming Education:   Managing "Off" Periods:  Return of Parkinson's Symptoms.  Wednesday, March 20th, 1-2 pm Parkinson's 101.  Wednesday, April 3rd, 1-2 pm Expert Briefing:  Understanding Pain in Parkinson's.   Wednesday, March 13th, 1-2 pm  Research Update:  Working to [employer] PD.  Wednesday, April 10th, 1-2 pm Register for virtual education and Patent attorney (webinars) at [email or web address].pl Please check out their website to sign up for emails and see their full online offerings     [place]:  [email or web address]   Third Thursday Webinars:  On the third Thursday of every month at 12 p.m. ET, join our free live webinars to learn about various aspects of living with Parkinson's disease and our work  to speed medical breakthroughs.  Upcoming Webinar:  Everyday Exposure to Parkinson's:  Environmental Connections to the Disease.  Thursday, March  21st at 12 noon. Check out additional information on their website to see their full online offerings    [facility]:  [email or web address]  Upcoming Webinar:   Nutrition and Parkinson's.  Wednesday, March 6th, 12 noon Webinar Series:  Living with Parkinson's Meetup.   Third Thursdays each month, 3 pm  Care Partner Monthly Meetup.  With Robin Searing Phinney.  First Tuesday of each month, 2 pm  Check out additional information to Live Well Today on their website    Parkinson and Movement Disorders (PMD) Alliance:  www.pmdalliance.org  NeuroLife Online:  Online Education Events  Sign up for emails, which are sent weekly to give you updates on programming and online offerings    Parkinson's Association of the [place]:  www.parkinsonassociation.org  Information on online support groups, education events, and online exercises including Yoga, Parkinson's exercises and more-LOTS of information on links to PD resources and online events  Virtual Support Group through Parkinson's Association of the [place]; next one is scheduled for Wednesday, March 6th  MOVEMENT AND EXERCISE OPPORTUNITIES  PWR! Moves Classes at [place].  Wednesdays 10 and 11 am.   Contact Amy Marriott, PT [email or web address] if interested.  PWR! Moves Class offerings at [employer]. *TUESDAYS* and Wednesdays 1-2 pm.    Contact Vonna Kotyk at  [email or web address]    Parkinson's Wellness Recovery (PWR! Moves)  www.[email or web address]  Info on the PWR! Virtual Experience:  You will have access to our expertise?through self-assessment, guided plans that start with the PD-specific fundamentals, educational content, tips, Q&A with an expert, and a growing Art therapist of PD-specific pre-recorded and live exercise classes of varying types and intensity - both physical and cognitive! If that is not enough, we offer 1:1 wellness consultations (in-person or virtual) to personalize  your PWR! Research scientist (medical).   [place] Fridays:   As part of the PD Health @ Home program, this free video series focuses each week on one aspect of fitness designed to support people living with Parkinson's.? These weekly videos highlight the [place] fitness guidelines for people with Parkinson's disease.  ModemG[employer].si  Dance for PD website is offering free, live-stream classes throughout the week, as well as links to [employer] of classes:  [email or web address]  Virtual dance and Pilates for Parkinson's classes: Click on the Community Tab> Parkinson's Movement Initiative Tab.  To register for classes and for more information, visit [email or web address] and click the "community" tab.   [employer] Parkinson's Cycling Classes   [employer]:  Thursdays @ Noon-Live classes at [employer] ([employer] at [email or web address]?or [phone])  [employer]: Virtual Classes Mondays and Thursdays Jeanette Caprice classes Tuesday, Wednesday and Thursday (contact [place] at [email or web address] ?or [phone])  [place]  Varied levels of classes are offered Tuesdays and Thursdays at [employer].   Stretching with Verdis Frederickson weekly class is also offered for people with Parkinson's  To observe a class or for more information, call [phone] or email Hezzie Bump at [email or web address]   ADDITIONAL SUPPORT AND RESOURCES  Well[employer]:Online Caregiver Education Opportunities:  www.well-springsolutions.org/caregiver-education/caregiver-support-group.  You may also contact Vickki Muff at [email or web address] or [phone].     Family Caregiver [phone].  Thursday, March 7th, 10:15-1:45 at [facility].  Register with [facility] (see above) Well-Spring Navigator:  Just1Navigator program, a?free service to help individuals  and families through the journey of determining care  for older adults.  The "Navigator" is a Education officer, museum, Arnell Asal, who will speak with a prospective client and/or loved ones to provide an assessment of the situation and a set of recommendations for a personalized care plan -- all free of charge, and whether?Well[employer]Spring [employer] offers the needed service or not. If the need is not a service we provide, we are well-connected with reputable programs in town that we can refer you to.  www.well-springsolutions.org or to speak with the Navigator, call [phone].

## 2022-12-31 ENCOUNTER — Encounter: Payer: Self-pay | Admitting: Nurse Practitioner

## 2022-12-31 ENCOUNTER — Telehealth (INDEPENDENT_AMBULATORY_CARE_PROVIDER_SITE_OTHER): Payer: 59 | Admitting: Nurse Practitioner

## 2022-12-31 VITALS — Resp 16 | Ht 70.0 in | Wt 219.0 lb

## 2022-12-31 DIAGNOSIS — J0110 Acute frontal sinusitis, unspecified: Secondary | ICD-10-CM | POA: Diagnosis not present

## 2022-12-31 MED ORDER — AMOXICILLIN-POT CLAVULANATE 875-125 MG PO TABS: 1.0000 | ORAL_TABLET | Freq: Two times a day (BID) | ORAL | 0 refills | Status: AC

## 2022-12-31 NOTE — Progress Notes (Signed)
[facility] [address] [place], [place] [address]  Internal MEDICINE  Telephone Visit  Patient Name: James Bush  [id number]  [id number]  Date of Service: 12/31/2022  I connected with the patient at 0840 by telephone and verified the patients identity using two identifiers.   I discussed the limitations, risks, security and privacy concerns of performing an evaluation and management service by telephone and the availability of in person appointments. I also discussed with the patient that there may be a patient responsible charge related to the service.  The patient expressed understanding and agrees to proceed.    Chief Complaint  Patient presents with   Telephone Screen    Since Friday of last week coughing green and yellow mucus.    Telephone Assessment    HPI Vander presents for a telehealth virtual visit for upper respiratory infection. Symptoms started 6 days ago Reports cough, yellow and green mucous, headache, fever, chills, fatigue, some SOB   Current Medication: Outpatient Encounter Medications as of 12/31/2022  Medication Sig   albuterol (VENTOLIN HFA) 108 (90 Base) MCG/ACT inhaler Inhale 2 puffs into the lungs every 6 (six) hours as needed for wheezing or shortness of breath.   amLODipine (NORVASC) 5 MG tablet Take 1 tablet (5 mg total) by mouth daily.   amoxicillin-clavulanate (AUGMENTIN) 875-125 MG tablet Take 1 tablet by mouth 2 (two) times daily for 10 days. Take with food.   aspirin 81 MG tablet Take 81 mg by mouth daily.   carbidopa-levodopa (SINEMET CR) 50-200 MG tablet Take 1 tablet by mouth at bedtime.   carbidopa-levodopa (SINEMET IR) 25-100 MG tablet 2 tablets at 5am/ 1 tablet at 9am/1pm/5pm.   losartan-hydrochlorothiazide (HYZAAR) 50-12.5 MG tablet Take 1 tablet by mouth daily.   Multiple Vitamin (MULTIVITAMIN WITH MINERALS) TABS tablet Take 1 tablet by mouth daily.   rosuvastatin (CRESTOR) 10 MG tablet Take 1 tablet (10 mg total) by mouth daily.    tadalafil (CIALIS) 20 MG tablet Take 1 tablet (20 mg total) by mouth daily as needed for erectile dysfunction.   No facility-administered encounter medications on file as of 12/31/2022.    Surgical History: Past Surgical History:  Procedure Laterality Date   CARDIAC ELECTROPHYSIOLOGY STUDY AND ABLATION     COLONOSCOPY N/A 03/20/2021   Procedure: COLONOSCOPY;  Surgeon: Wyline Mood, MD;  Location: [facility] ENDOSCOPY;  Service: Gastroenterology;  Laterality: N/A;   EYE SURGERY     strabismus as child   KNEE ARTHROPLASTY Right     Medical History: Past Medical History:  Diagnosis Date   Asthma    Heart murmur    Hyperlipidemia    Hypertension    Parkinson disease     Family History: Family History  Problem Relation Age of Onset   COPD Mother    Multiple sclerosis Son    Healthy Daughter    Healthy Daughter    Hematuria Neg Hx    Kidney cancer Neg Hx    Prostate cancer Neg Hx     Social History   Socioeconomic History   Marital status: Married    Spouse name: Not on file   Number of children: Not on file   Years of education: Not on file   Highest education level: Not on file  Occupational History   Not on file  Tobacco Use   Smoking status: Never    Passive exposure: Never   Smokeless tobacco: Never  Vaping Use   Vaping Use: Never used  Substance and Sexual  Activity   Alcohol use: Yes    Alcohol/week: 0.0 standard drinks of alcohol    Comment: 1 beer/month   Drug use: No   Sexual activity: Not on file  Other Topics Concern   Not on file  Social History Narrative   Right Handed    Lives in a one story home   Social Determinants of Health   Financial Resource Strain: Not on file  Food Insecurity: Not on file  Transportation Needs: Not on file  Physical Activity: Not on file  Stress: Not on file  Social Connections: Not on file  Intimate Partner Violence: Not on file      Review of Systems  Constitutional:  Positive for chills, fatigue and fever.   HENT:  Positive for congestion, postnasal drip, rhinorrhea, sneezing and sore throat. Negative for ear pain, sinus pressure and sinus pain.   Respiratory:  Positive for cough and shortness of breath. Negative for chest tightness and wheezing.   Cardiovascular: Negative.  Negative for chest pain and palpitations.  Gastrointestinal: Negative.  Negative for diarrhea, nausea and vomiting.  Neurological:  Positive for headaches.    Vital Signs: Resp 16   Ht 5\' 10"  (1.778 m)   Wt 219 lb (99.3 kg)   BMI 31.42 kg/m    Observation/Objective: He is alert and oriented and engages in conversation appropriately. Voice sounds hoarse. No acute distress noted.     Assessment/Plan: 1. Acute non-recurrent frontal sinusitis Empiric antibiotic treatment prescribed.  - amoxicillin-clavulanate (AUGMENTIN) 875-125 MG tablet; Take 1 tablet by mouth 2 (two) times daily for 10 days. Take with food.  Dispense: 20 tablet; Refill: 0   General Counseling: Kamryn verbalizes understanding of the findings of today's phone visit and agrees with plan of treatment. I have discussed any further diagnostic evaluation that may be needed or ordered today. We also reviewed his medications today. he has been encouraged to call the office with any questions or concerns that should arise related to todays visit.  Return if symptoms worsen or fail to improve.   No orders of the defined types were placed in this encounter.   Meds ordered this encounter  Medications   amoxicillin-clavulanate (AUGMENTIN) 875-125 MG tablet    Sig: Take 1 tablet by mouth 2 (two) times daily for 10 days. Take with food.    Dispense:  20 tablet    Refill:  0    Time spent:10 Minutes Time spent with patient included reviewing progress notes, labs, imaging studies, and discussing plan for follow up.  [place] Controlled Substance Database was reviewed by me for overdose risk score (ORS) if appropriate.  This patient was seen by Sallyanne Kuster,  FNP-C in collaboration with Dr. Beverely Risen as a part of collaborative care agreement.  Toshiye Kever R. Tedd Sias, MSN, FNP-C Internal medicine

## 2023-01-06 ENCOUNTER — Telehealth: Payer: Self-pay | Admitting: Nurse Practitioner

## 2023-01-06 NOTE — Telephone Encounter (Signed)
Received MR request and disability form from [employer]. Records printed. Form given to Alyssa for completion-Toni

## 2023-01-13 ENCOUNTER — Telehealth: Payer: Self-pay | Admitting: Nurse Practitioner

## 2023-01-13 NOTE — Telephone Encounter (Addendum)
Disability form and MR faxed to [facility]; [phone]. Scanned. Notified patient. He will p/u copy-Toni

## 2023-02-04 ENCOUNTER — Other Ambulatory Visit: Payer: 59

## 2023-02-04 DIAGNOSIS — C61 Malignant neoplasm of prostate: Secondary | ICD-10-CM | POA: Diagnosis not present

## 2023-02-05 LAB — PSA: Prostate Specific Ag, Serum: 9.3 ng/mL — ABNORMAL HIGH (ref 0.0–4.0)

## 2023-02-10 ENCOUNTER — Ambulatory Visit: Payer: No Typology Code available for payment source | Admitting: Urology

## 2023-02-11 ENCOUNTER — Encounter: Payer: Self-pay | Admitting: Physician Assistant

## 2023-02-11 ENCOUNTER — Ambulatory Visit: Payer: 59 | Admitting: Physician Assistant

## 2023-02-11 VITALS — BP 122/75 | HR 94 | Temp 98.0°F | Resp 16 | Ht 70.0 in | Wt 232.6 lb

## 2023-02-11 DIAGNOSIS — R0602 Shortness of breath: Secondary | ICD-10-CM

## 2023-02-11 DIAGNOSIS — I10 Essential (primary) hypertension: Secondary | ICD-10-CM | POA: Diagnosis not present

## 2023-02-11 DIAGNOSIS — G4733 Obstructive sleep apnea (adult) (pediatric): Secondary | ICD-10-CM | POA: Diagnosis not present

## 2023-02-11 DIAGNOSIS — J069 Acute upper respiratory infection, unspecified: Secondary | ICD-10-CM | POA: Diagnosis not present

## 2023-02-11 DIAGNOSIS — Z7189 Other specified counseling: Secondary | ICD-10-CM

## 2023-02-11 MED ORDER — AZITHROMYCIN 250 MG PO TABS: ORAL_TABLET | ORAL | 0 refills | Status: AC

## 2023-02-11 MED ORDER — ALBUTEROL SULFATE HFA 108 (90 BASE) MCG/ACT IN AERS: 2.0000 | INHALATION_SPRAY | Freq: Four times a day (QID) | RESPIRATORY_TRACT | 5 refills | Status: DC | PRN

## 2023-02-11 NOTE — Progress Notes (Signed)
[facility] [address] [place], [place] [address]  Pulmonary Sleep Medicine   Office Visit Note  Patient Name: James Bush DOB: [birth year] MRN [id number]  Date of Service: 02/11/2023  Complaints/HPI: Pt is here for routine follow up for OSA on CPAP. Due to change out supplies, but states he has plenty at home. Uses [employer]. Keeps supplies clean. Denies dryness on machine. Unfortunately no download is available today, but patient reports using nightly and benefiting from use. Occasionally SOB in evening when sitting since sinus infection 1 month ago. A little cough and wheezing as well. Has been improving slowly. Using albuterol 2-3 times per day since being sick. Normally would hardly ever use it. Does have some drainage and sinus congestion still as well. Will go ahead and treat with course of zpak and continue albuterol, if not improving may need chest xray. Discussed may be lingering bronchitis slowly improving.  ROS  General: (-) fever, (-) chills, (-) night sweats, (-) weakness Skin: (-) rashes, (-) itching,. Eyes: (-) visual changes, (-) redness, (-) itching. Nose and Sinuses: (-) nasal stuffiness or itchiness, (-) postnasal drip, (-) nosebleeds, (-) sinus trouble. Mouth and Throat: (-) sore throat, (-) hoarseness. Neck: (-) swollen glands, (-) enlarged thyroid, (-) neck pain. Respiratory: + cough, (-) bloody sputum, + shortness of breath, + wheezing. Cardiovascular: + ankle swelling, (-) chest pain. Lymphatic: (-) lymph node enlargement. Neurologic: (-) numbness, (-) tingling. Psychiatric: (-) anxiety, (-) depression   Current Medication: Outpatient Encounter Medications as of 02/11/2023  Medication Sig   amLODipine (NORVASC) 5 MG tablet Take 1 tablet (5 mg total) by mouth daily.   aspirin 81 MG tablet Take 81 mg by mouth daily.   azithromycin (ZITHROMAX) 250 MG tablet Take 2 tablets on day 1, then 1 tablet daily on days 2 through 5   carbidopa-levodopa (SINEMET  CR) 50-200 MG tablet Take 1 tablet by mouth at bedtime.   carbidopa-levodopa (SINEMET IR) 25-100 MG tablet 2 tablets at 5am/ 1 tablet at 9am/1pm/5pm.   losartan-hydrochlorothiazide (HYZAAR) 50-12.5 MG tablet Take 1 tablet by mouth daily.   Multiple Vitamin (MULTIVITAMIN WITH MINERALS) TABS tablet Take 1 tablet by mouth daily.   rosuvastatin (CRESTOR) 10 MG tablet Take 1 tablet (10 mg total) by mouth daily.   tadalafil (CIALIS) 20 MG tablet Take 1 tablet (20 mg total) by mouth daily as needed for erectile dysfunction.   [DISCONTINUED] albuterol (VENTOLIN HFA) 108 (90 Base) MCG/ACT inhaler Inhale 2 puffs into the lungs every 6 (six) hours as needed for wheezing or shortness of breath.   albuterol (VENTOLIN HFA) 108 (90 Base) MCG/ACT inhaler Inhale 2 puffs into the lungs every 6 (six) hours as needed for wheezing or shortness of breath.   No facility-administered encounter medications on file as of 02/11/2023.    Surgical History: Past Surgical History:  Procedure Laterality Date   CARDIAC ELECTROPHYSIOLOGY STUDY AND ABLATION     COLONOSCOPY N/A 03/20/2021   Procedure: COLONOSCOPY;  Surgeon: Wyline Mood, MD;  Location: [facility] ENDOSCOPY;  Service: Gastroenterology;  Laterality: N/A;   EYE SURGERY     strabismus as child   KNEE ARTHROPLASTY Right     Medical History: Past Medical History:  Diagnosis Date   Asthma    Heart murmur    Hyperlipidemia    Hypertension    Parkinson disease     Family History: Family History  Problem Relation Age of Onset   COPD Mother    Multiple sclerosis Son  Healthy Daughter    Healthy Daughter    Hematuria Neg Hx    Kidney cancer Neg Hx    Prostate cancer Neg Hx     Social History: Social History   Socioeconomic History   Marital status: Married    Spouse name: Not on file   Number of children: Not on file   Years of education: Not on file   Highest education level: Not on file  Occupational History   Not on file  Tobacco Use   Smoking  status: Never    Passive exposure: Never   Smokeless tobacco: Never  Vaping Use   Vaping Use: Never used  Substance and Sexual Activity   Alcohol use: Yes    Alcohol/week: 0.0 standard drinks of alcohol    Comment: 1 beer/month   Drug use: No   Sexual activity: Not on file  Other Topics Concern   Not on file  Social History Narrative   Right Handed    Lives in a one story home   Social Determinants of Health   Financial Resource Strain: Not on file  Food Insecurity: Not on file  Transportation Needs: Not on file  Physical Activity: Not on file  Stress: Not on file  Social Connections: Not on file  Intimate Partner Violence: Not on file    Vital Signs: Blood pressure 122/75, pulse 94, temperature 98 F (36.7 C), resp. rate 16, height 5\' 10"  (1.778 m), weight 232 lb 9.6 oz (105.5 kg), SpO2 99 %.  Examination: General Appearance: The patient is well-developed, well-nourished, and in no distress. Skin: Gross inspection of skin unremarkable. Head: normocephalic, no gross deformities. Eyes: no gross deformities noted. ENT: ears appear grossly normal no exudates. Neck: Supple. No thyromegaly. No LAD. Respiratory: Lungs clear to auscultation. Cardiovascular: Normal S1 and S2 without murmur or rub. Extremities: No cyanosis. pulses are equal. Neurologic: Alert and oriented. No involuntary movements.  LABS: Recent Results (from the past 2160 hour(s))  PSA     Status: Abnormal   Collection Time: 02/04/23  9:54 AM  Result Value Ref Range   Prostate Specific Ag, Serum 9.3 (H) 0.0 - 4.0 ng/mL    Comment: Roche ECLIA methodology. According to the American Urological Association, Serum PSA should decrease and remain at undetectable levels after radical prostatectomy. The AUA defines biochemical recurrence as an initial PSA value 0.2 ng/mL or greater followed by a subsequent confirmatory PSA value 0.2 ng/mL or greater. Values obtained with different assay methods or kits cannot  be used interchangeably. Results cannot be interpreted as absolute evidence of the presence or absence of malignant disease.     Radiology: No results found.  No results found.  No results found.    Assessment and Plan: Patient Active Problem List   Diagnosis Date Noted   OSA (obstructive sleep apnea) 03/14/2021   Other hypersomnia 03/14/2021   Left ventricular hypokinesis 05/06/2020   Coronary artery disease due to lipid rich plaque 05/06/2020   Hypertension, poor control 10/20/2018   Dyspnea on exertion 08/18/2018   Parkinson's disease 04/14/2018   Tremor 03/17/2018   Essential hypertension 02/10/2018   Hyperlipidemia 02/10/2018   Encounter for general adult medical examination with abnormal findings 02/10/2018   Right-sided muscle weakness 02/10/2018   Dysuria 02/10/2018   1. Upper respiratory tract infection, unspecified type Will start zpak and mucinex and patient may call if not improving as a Chest xray may be considered given lingering symptoms for 1 month - azithromycin (ZITHROMAX) 250 MG tablet;  Take 2 tablets on day 1, then 1 tablet daily on days 2 through 5  Dispense: 6 tablet; Refill: 0  2. Shortness of breath Continue albuterol and start zpak if not improving may consider chest xray - albuterol (VENTOLIN HFA) 108 (90 Base) MCG/ACT inhaler; Inhale 2 puffs into the lungs every 6 (six) hours as needed for wheezing or shortness of breath.  Dispense: 18 g; Refill: 5  3. OSA (obstructive sleep apnea) Continue nightly use  4. CPAP use counseling CPAP couseling-Discussed importance of adequate CPAP use as well as proper care and cleaning techniques of machine and all supplies.  5. Essential hypertension Continue current medication and f/u with PCP.    General Counseling: I have discussed the findings of the evaluation and examination with Francee Piccolo.  I have also discussed any further diagnostic evaluation thatmay be needed or ordered today. Janari verbalizes  understanding of the findings of todays visit. We also reviewed his medications today and discussed drug interactions and side effects including but not limited excessive drowsiness and altered mental states. We also discussed that there is always a risk not just to him but also people around him. he has been encouraged to call the office with any questions or concerns that should arise related to todays visit.  No orders of the defined types were placed in this encounter.    Time spent: 30  I have personally obtained a history, examined the patient, evaluated laboratory and imaging results, formulated the assessment and plan and placed orders. This patient was seen by Lynn Ito, PA-C in collaboration with Dr. Freda Munro as a part of collaborative care agreement.     Yevonne Pax, MD [facility] Pulmonary and Critical Care Sleep medicine

## 2023-02-17 ENCOUNTER — Encounter: Payer: Self-pay | Admitting: Urology

## 2023-02-17 ENCOUNTER — Ambulatory Visit: Payer: 59 | Admitting: Urology

## 2023-02-17 VITALS — BP 144/87 | HR 83 | Ht 70.0 in | Wt 219.0 lb

## 2023-02-17 DIAGNOSIS — C61 Malignant neoplasm of prostate: Secondary | ICD-10-CM

## 2023-02-17 DIAGNOSIS — N529 Male erectile dysfunction, unspecified: Secondary | ICD-10-CM

## 2023-02-17 MED ORDER — TADALAFIL 20 MG PO TABS: 20.0000 mg | ORAL_TABLET | Freq: Every day | ORAL | 9 refills | Status: DC | PRN

## 2023-02-17 NOTE — Progress Notes (Signed)
   02/17/2023 12:07 PM   Mat Carne [birth year] [id number]  Reason for visit: Follow up low risk prostate cancer, lower urinary tract symptoms, ED   HPI: He is a 62 year old African-American male with Parkinson's disease who was diagnosed with very low risk prostate cancer in March 2017.  He does have a family history of non-lethal prostate cancer in his uncle.  PSA at time of initial diagnosis was 6.25, and biopsy showed 1/12 cores positive for Gleason score 3+3= 6 disease.  Confirmatory biopsy in October 2017 showed 2/12 cores of 3+3=6 disease, with max core involvement of 33%.  Prostate volume was 53 g, with PSA density of 0.13.   PSA had been stable ranging from 6.7-8.7 from 2019 through 2022, but most PSA from 06/26/2022 increased to 11.2.  Possible etiologies of false elevation discussed at length.  PSA decreased to 10.1 in November 2023, and continued to decrease at 9.3 from June 2024.  We reviewed options at length for the mild increase in PSA including a prostate MRI, repeat biopsy, repeat PSA in 6 months.  With the overall stability of the PSA as well as the decrease over the last 6 months, he would like to repeat the PSA in 6 months which I think is very reasonable.   He deferred DRE today and last year, he has had an asymmetric prostate previously.   He has a history of mild urinary symptoms of some frequency, and occasional urgency.  We previously had discussed cutting back on diet sodas which have improved his symptoms significantly.  He really denies any significant urinary complaints today, and has nocturia 0-1 time overnight.  We reviewed behavioral strategies, as well as the impact of Parkinson's on bladder symptoms and overactive bladder.  Could consider Myrbetriq in the future if worsening OAB symptoms.   Currently only using Cialis 20 mg on demand for ED which is working well.  -RTC 6 months for PSA, if significantly elevated can reconsider prostate MRI -Cialis  refilled   Sondra Come, MD  [facility] [address], [address] [place], [place] [address] [phone]

## 2023-03-15 ENCOUNTER — Encounter: Payer: 59 | Admitting: Nurse Practitioner

## 2023-03-15 ENCOUNTER — Other Ambulatory Visit: Payer: Self-pay | Admitting: Nurse Practitioner

## 2023-03-15 DIAGNOSIS — E782 Mixed hyperlipidemia: Secondary | ICD-10-CM

## 2023-04-01 ENCOUNTER — Ambulatory Visit (INDEPENDENT_AMBULATORY_CARE_PROVIDER_SITE_OTHER): Payer: 59 | Admitting: Nurse Practitioner

## 2023-04-01 ENCOUNTER — Encounter: Payer: Self-pay | Admitting: Nurse Practitioner

## 2023-04-01 VITALS — BP 135/88 | HR 74 | Temp 98.2°F | Resp 16 | Ht 70.0 in | Wt 230.4 lb

## 2023-04-01 DIAGNOSIS — E782 Mixed hyperlipidemia: Secondary | ICD-10-CM | POA: Diagnosis not present

## 2023-04-01 DIAGNOSIS — I10 Essential (primary) hypertension: Secondary | ICD-10-CM | POA: Diagnosis not present

## 2023-04-01 DIAGNOSIS — R300 Dysuria: Secondary | ICD-10-CM | POA: Diagnosis not present

## 2023-04-01 DIAGNOSIS — G20B2 Parkinson's disease with dyskinesia, with fluctuations: Secondary | ICD-10-CM | POA: Diagnosis not present

## 2023-04-01 DIAGNOSIS — Z0001 Encounter for general adult medical examination with abnormal findings: Secondary | ICD-10-CM | POA: Diagnosis not present

## 2023-04-01 MED ORDER — LOSARTAN POTASSIUM-HCTZ 50-12.5 MG PO TABS
1.0000 | ORAL_TABLET | Freq: Every day | ORAL | 1 refills | Status: DC
Start: 2023-04-01 — End: 2023-09-23

## 2023-04-01 MED ORDER — AMLODIPINE BESYLATE 5 MG PO TABS
5.0000 mg | ORAL_TABLET | Freq: Every day | ORAL | 1 refills | Status: DC
Start: 2023-04-01 — End: 2023-10-04

## 2023-04-01 MED ORDER — ROSUVASTATIN CALCIUM 10 MG PO TABS: 10.0000 mg | ORAL_TABLET | Freq: Every day | ORAL | 5 refills | Status: DC

## 2023-04-01 MED ORDER — ROSUVASTATIN CALCIUM 20 MG PO TABS: 20.0000 mg | ORAL_TABLET | Freq: Every day | ORAL | 1 refills | Status: DC

## 2023-04-01 NOTE — Progress Notes (Signed)
[facility] [address] [place], [place] [address]  Internal MEDICINE  Office Visit Note  Patient Name: James Bush  [id number]  [id number]  Date of Service: 04/01/2023  Chief Complaint  Patient presents with   Annual Exam   Hyperlipidemia   Hypertension    HPI James Bush presents for an annual well visit and physical exam.  Well-appearing 62 y.o. male with asthma, atrial fibrillation, hyperlipidemia, parkinson's disease, and hypertension  Routine CRC screening: due in 2032.  Labs: labs done in February. PSA done in June.  New or worsening pain: none Other concerns: tremors controlled pretty well with carbidopa-levodopa.  Due for medication refills.  No other significant changes in diet or lifestyle.  Sees neurology for parkinson's disease.    Current Medication: Outpatient Encounter Medications as of 04/01/2023  Medication Sig   albuterol (VENTOLIN HFA) 108 (90 Base) MCG/ACT inhaler Inhale 2 puffs into the lungs every 6 (six) hours as needed for wheezing or shortness of breath.   aspirin 81 MG tablet Take 81 mg by mouth daily.   carbidopa-levodopa (SINEMET CR) 50-200 MG tablet Take 1 tablet by mouth at bedtime.   carbidopa-levodopa (SINEMET IR) 25-100 MG tablet 2 tablets at 5am/ 1 tablet at 9am/1pm/5pm.   Multiple Vitamin (MULTIVITAMIN WITH MINERALS) TABS tablet Take 1 tablet by mouth daily.   tadalafil (CIALIS) 20 MG tablet Take 1 tablet (20 mg total) by mouth daily as needed for erectile dysfunction.   [DISCONTINUED] amLODipine (NORVASC) 5 MG tablet Take 1 tablet (5 mg total) by mouth daily.   [DISCONTINUED] losartan-hydrochlorothiazide (HYZAAR) 50-12.5 MG tablet Take 1 tablet by mouth daily.   [DISCONTINUED] rosuvastatin (CRESTOR) 10 MG tablet TAKE 1 TABLET BY MOUTH EVERY DAY   [DISCONTINUED] rosuvastatin (CRESTOR) 20 MG tablet Take 1 tablet (20 mg total) by mouth daily.   amLODipine (NORVASC) 5 MG tablet Take 1 tablet (5 mg total) by mouth daily.    losartan-hydrochlorothiazide (HYZAAR) 50-12.5 MG tablet Take 1 tablet by mouth daily.   rosuvastatin (CRESTOR) 10 MG tablet Take 1 tablet (10 mg total) by mouth daily.   No facility-administered encounter medications on file as of 04/01/2023.    Surgical History: Past Surgical History:  Procedure Laterality Date   CARDIAC ELECTROPHYSIOLOGY STUDY AND ABLATION     COLONOSCOPY N/A 03/20/2021   Procedure: COLONOSCOPY;  Surgeon: Wyline Mood, MD;  Location: [facility] ENDOSCOPY;  Service: Gastroenterology;  Laterality: N/A;   EYE SURGERY     strabismus as child   KNEE ARTHROPLASTY Right     Medical History: Past Medical History:  Diagnosis Date   Asthma    Heart murmur    Hyperlipidemia    Hypertension    Parkinson disease     Family History: Family History  Problem Relation Age of Onset   COPD Mother    Multiple sclerosis Son    Healthy Daughter    Healthy Daughter    Hematuria Neg Hx    Kidney cancer Neg Hx    Prostate cancer Neg Hx     Social History   Socioeconomic History   Marital status: Married    Spouse name: Not on file   Number of children: Not on file   Years of education: Not on file   Highest education level: Not on file  Occupational History   Not on file  Tobacco Use   Smoking status: Never    Passive exposure: Never   Smokeless tobacco: Never  Vaping Use   Vaping status: Never Used  Substance and Sexual Activity   Alcohol use: Yes    Alcohol/week: 0.0 standard drinks of alcohol    Comment: 1 beer/month   Drug use: No   Sexual activity: Not on file  Other Topics Concern   Not on file  Social History Narrative   Right Handed    Lives in a one story home   Social Determinants of Health   Financial Resource Strain: Not on file  Food Insecurity: Not on file  Transportation Needs: Not on file  Physical Activity: Not on file  Stress: Not on file  Social Connections: Not on file  Intimate Partner Violence: Not on file      Review of Systems   Constitutional:  Positive for fatigue. Negative for chills and unexpected weight change.  HENT:  Positive for postnasal drip. Negative for congestion, rhinorrhea, sneezing and sore throat.   Eyes:  Negative for redness.  Respiratory:  Negative for cough, chest tightness and shortness of breath.   Cardiovascular:  Negative for chest pain and palpitations.  Gastrointestinal:  Negative for abdominal pain, constipation, diarrhea, nausea and vomiting.  Genitourinary:  Negative for dysuria and frequency.  Musculoskeletal:  Negative for arthralgias, back pain, joint swelling and neck pain.  Skin:  Negative for rash.  Neurological: Negative.  Negative for tremors and numbness.  Hematological:  Negative for adenopathy. Does not bruise/bleed easily.  Psychiatric/Behavioral:  Negative for behavioral problems (Depression), sleep disturbance and suicidal ideas. The patient is not nervous/anxious.     Vital Signs: BP 135/88   Pulse 74   Temp 98.2 F (36.8 C)   Resp 16   Ht 5\' 10"  (1.778 m)   Wt 230 lb 6.4 oz (104.5 kg)   SpO2 99%   BMI 33.06 kg/m    Physical Exam Vitals reviewed.  Constitutional:      General: He is not in acute distress.    Appearance: He is well-developed. He is not diaphoretic.  HENT:     Head: Normocephalic and atraumatic.     Mouth/Throat:     Pharynx: No oropharyngeal exudate.  Eyes:     Pupils: Pupils are equal, round, and reactive to light.  Neck:     Thyroid: No thyromegaly.     Vascular: No JVD.     Trachea: No tracheal deviation.  Cardiovascular:     Rate and Rhythm: Normal rate and regular rhythm.     Heart sounds: Normal heart sounds. No murmur heard.    No friction rub. No gallop.  Pulmonary:     Effort: Pulmonary effort is normal. No respiratory distress.     Breath sounds: No wheezing or rales.  Chest:     Chest wall: No tenderness.  Abdominal:     General: Bowel sounds are normal.     Palpations: Abdomen is soft.  Musculoskeletal:         General: Normal range of motion.     Cervical back: Normal range of motion and neck supple.  Lymphadenopathy:     Cervical: No cervical adenopathy.  Skin:    General: Skin is warm and dry.  Neurological:     Mental Status: He is alert and oriented to person, place, and time.     Cranial Nerves: No cranial nerve deficit.  Psychiatric:        Behavior: Behavior normal.        Thought Content: Thought content normal.        Judgment: Judgment normal.  Assessment/Plan: 1. Encounter for routine adult health examination with abnormal findings Age-appropriate preventive screenings and vaccinations discussed, annual physical exam completed. Routine labs for health maintenance deferred until February next year. PHM updated.   2. Parkinson's disease with dyskinesia and fluctuating manifestations Followed up neurology   3. Essential hypertension Continue amlodipine and losartan-hydrochlorothiazide as prescribed.  - amLODipine (NORVASC) 5 MG tablet; Take 1 tablet (5 mg total) by mouth daily.  Dispense: 90 tablet; Refill: 1 - losartan-hydrochlorothiazide (HYZAAR) 50-12.5 MG tablet; Take 1 tablet by mouth daily.  Dispense: 90 tablet; Refill: 1  4. Mixed hyperlipidemia Continue rosuvastatin as prescribed  - rosuvastatin (CRESTOR) 10 MG tablet; Take 1 tablet (10 mg total) by mouth daily.  Dispense: 30 tablet; Refill: 5  5. Dysuria Routine urinalysis done  - UA/M w/rflx Culture, Routine       General Counseling: James Bush verbalizes understanding of the findings of todays visit and agrees with plan of treatment. I have discussed any further diagnostic evaluation that may be needed or ordered today. We also reviewed his medications today. he has been encouraged to call the office with any questions or concerns that should arise related to todays visit.    Orders Placed This Encounter  Procedures   UA/M w/rflx Culture, Routine    Meds ordered this encounter  Medications    amLODipine (NORVASC) 5 MG tablet    Sig: Take 1 tablet (5 mg total) by mouth daily.    Dispense:  90 tablet    Refill:  1   losartan-hydrochlorothiazide (HYZAAR) 50-12.5 MG tablet    Sig: Take 1 tablet by mouth daily.    Dispense:  90 tablet    Refill:  1   DISCONTD: rosuvastatin (CRESTOR) 20 MG tablet    Sig: Take 1 tablet (20 mg total) by mouth daily.    Dispense:  90 tablet    Refill:  1   rosuvastatin (CRESTOR) 10 MG tablet    Sig: Take 1 tablet (10 mg total) by mouth daily.    Dispense:  30 tablet    Refill:  5    Disregard previous 20 mg dose, fill 10 mg tablet dose instead thanks    Return in about 6 months (around 10/02/2023) for F/U,  PCP.   Total time spent:30 Minutes Time spent includes review of chart, medications, test results, and follow up plan with the patient.    Controlled Substance Database was reviewed by me.  This patient was seen by Sallyanne Kuster, FNP-C in collaboration with Dr. Beverely Risen as a part of collaborative care agreement.   R. Tedd Sias, MSN, FNP-C Internal medicine

## 2023-04-04 ENCOUNTER — Encounter: Payer: Self-pay | Admitting: Nurse Practitioner

## 2023-04-26 ENCOUNTER — Telehealth: Payer: Self-pay | Admitting: Nurse Practitioner

## 2023-04-26 NOTE — Telephone Encounter (Addendum)
Received [employer] medicaid disability. Mailed back to [facility] [employer] [place] [address] [place], [place] [address] all medical records from 01/18/2021

## 2023-04-29 ENCOUNTER — Other Ambulatory Visit: Payer: Self-pay | Admitting: Neurology

## 2023-04-29 ENCOUNTER — Other Ambulatory Visit: Payer: Self-pay

## 2023-04-29 ENCOUNTER — Other Ambulatory Visit: Payer: Self-pay | Admitting: Nurse Practitioner

## 2023-04-29 DIAGNOSIS — R498 Other voice and resonance disorders: Secondary | ICD-10-CM

## 2023-04-29 MED ORDER — CARBIDOPA-LEVODOPA ER 50-200 MG PO TBCR: 1.0000 | EXTENDED_RELEASE_TABLET | Freq: Every day | ORAL | 0 refills | Status: DC

## 2023-04-29 MED ORDER — CARBIDOPA-LEVODOPA 25-100 MG PO TABS: ORAL_TABLET | ORAL | 0 refills | Status: DC

## 2023-05-07 NOTE — Progress Notes (Unsigned)
Assessment/Plan:   1.  Parkinsons Disease, diagnosed July, 2019 with symptoms 2 years prior  -Increase carbidopa/levodopa 25/100, 2 tablets at 5 AM, 2 tablet at 9 AM/2 at 2 PM/ and 1 at 5 PM  -Continue carbidopa/levodopa 50/200 at bedtime   -he declined DA therapy  2.  REM behavior disorder  -add klonopin 0.5 mg, 1/2 po q hs.  R/B/SE were discussed.  The opportunity to ask questions was given and they were answered to the best of my ability.  The patient expressed understanding and willingness to follow the outlined treatment protocols.  They understand that this is a controlled substance.  They understand that they should not get this from any other provider except myself.  3.  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  His lightheadedness is improved currently.  4.  OSAS  -PSG in June, 2022 with AHI 10.  Now on CPAP and following with pulm.  5.  Sialorrhea  -Patient not ready for Botox.  Subjective:   James Bush was seen today in follow up for Parkinsons disease.  My previous records were reviewed prior to todays visit as well as outside records available to me.  Patient with his wife who supplements the history.  Patient remains on several blood pressure medications per pcp notes of 8/1.  Some visual distortions but no hallucinations.  Having active dreaming - wife states "if I turned a certain way, he would knock my head off."  He has knocked stuff off of the night stand.  Lightheadedness is improved.    Current prescribed movement disorder medications: carbidopa/levodopa 25/100, 2 tablets at 5 AM, 1 tablet at 9 AM/1 PM/5 PM  Carbidopa/levodopa 50/200 at bedtime   PREVIOUS MEDICATIONS: Carbidopa/levodopa 25/100 CR (I discontinued this when I first started seeing him to change him to the IR)  ALLERGIES:  No Known  Allergies  CURRENT MEDICATIONS:  Outpatient Encounter Medications as of 05/11/2023  Medication Sig   albuterol (VENTOLIN HFA) 108 (90 Base) MCG/ACT inhaler Inhale 2 puffs into the lungs every 6 (six) hours as needed for wheezing or shortness of breath.   aspirin 81 MG tablet Take 81 mg by mouth daily.   carbidopa-levodopa (SINEMET CR) 50-200 MG tablet Take 1 tablet by mouth at bedtime.   carbidopa-levodopa (SINEMET IR) 25-100 MG tablet 2 tablets at 5am/ 1 tablet at 9am/1pm/5pm.   losartan-hydrochlorothiazide (HYZAAR) 50-12.5 MG tablet Take 1 tablet by mouth daily.   Multiple Vitamin (MULTIVITAMIN WITH MINERALS) TABS tablet Take 1 tablet by mouth daily.   rOPINIRole (REQUIP) 0.25 MG tablet Take 0.25 mg by mouth daily.   rosuvastatin (CRESTOR) 10 MG tablet Take 1 tablet (10 mg total) by mouth daily.   tadalafil (CIALIS) 20 MG tablet Take 1 tablet (20 mg total) by mouth daily as needed for erectile dysfunction.   amLODipine (NORVASC) 5 MG tablet Take 1 tablet (5 mg total) by mouth daily. (Patient not taking: Reported on 05/11/2023)   No facility-administered encounter medications on file as of 05/11/2023.    Objective:   PHYSICAL EXAMINATION:    VITALS:   Vitals:   05/11/23 1055  BP: 126/74  Pulse: 75  SpO2: 95%  Weight: 229 lb 6.4 oz (104.1 kg)  Height: 5\' 10"  (1.778 m)   GEN:  The patient appears stated age and is in NAD. HEENT:  Normocephalic, atraumatic.  The mucous  membranes are moist.  Cv:  rrr Lungs:  CTAB  Neurological examination:  Orientation: The patient is alert and oriented x3. Cranial nerves: There is good facial symmetry with facial hypomimia. The speech is fluent and clear. Soft palate rises symmetrically and there is no tongue deviation. Hearing is intact to conversational tone. Sensation: Sensation is intact to light touch throughout Motor: Strength is at least antigravity x4.  Movement examination: Tone: There is  mild increased tone in the RUE Abnormal  movements: none even with distraction Coordination:  There is mild decremation with RAM's, with finger and toe taps on the R Gait and Station: The patient has no difficulty arising out of a deep-seated chair without the use of the hands.  He ambulates well in the hall, with decreased arm swing.  I have reviewed and interpreted the following labs independently    Chemistry      Component Value Date/Time   NA 141 10/15/2022 1000   K 4.0 10/15/2022 1000   CL 101 10/15/2022 1000   CO2 25 10/15/2022 1000   BUN 16 10/15/2022 1000   CREATININE 0.95 10/15/2022 1000      Component Value Date/Time   CALCIUM 9.4 10/15/2022 1000   ALKPHOS 63 10/15/2022 1000   AST 11 10/15/2022 1000   ALT 4 10/15/2022 1000   BILITOT 0.4 10/15/2022 1000       Lab Results  Component Value Date   WBC 6.3 10/15/2022   HGB 13.9 10/15/2022   HCT 41.2 10/15/2022   MCV 90 10/15/2022   PLT 268 10/15/2022    Lab Results  Component Value Date   TSH 1.041 03/28/2020     Total time spent on today's visit was 30 minutes, including both face-to-face time and nonface-to-face time.  Time included that spent on review of records (prior notes available to me/labs/imaging if pertinent), discussing treatment and goals, answering patient's questions and coordinating care.  Cc:  Sallyanne Kuster, NP

## 2023-05-11 ENCOUNTER — Ambulatory Visit: Payer: 59 | Admitting: Neurology

## 2023-05-11 VITALS — BP 126/74 | HR 75 | Ht 70.0 in | Wt 229.4 lb

## 2023-05-11 DIAGNOSIS — G20A1 Parkinson's disease without dyskinesia, without mention of fluctuations: Secondary | ICD-10-CM

## 2023-05-11 DIAGNOSIS — G4752 REM sleep behavior disorder: Secondary | ICD-10-CM | POA: Diagnosis not present

## 2023-05-11 MED ORDER — CARBIDOPA-LEVODOPA 25-100 MG PO TABS: ORAL_TABLET | ORAL | 1 refills | Status: DC

## 2023-05-11 MED ORDER — CLONAZEPAM 0.5 MG PO TABS: 0.2500 mg | ORAL_TABLET | Freq: Every day | ORAL | 1 refills | Status: DC

## 2023-05-11 MED ORDER — CARBIDOPA-LEVODOPA ER 50-200 MG PO TBCR: 1.0000 | EXTENDED_RELEASE_TABLET | Freq: Every day | ORAL | 2 refills | Status: DC

## 2023-05-11 NOTE — Patient Instructions (Addendum)
Increase carbidopa/levodopa 25/100, 2 tablets at 5 AM, 2 tablet at 9 AM/2 at 2 PM/ and 1 at 5 PM Continue carbidopa/levodopa 50/200 at bed  ADD klonopin 0.5 mg, 1/2 tablet at bed  SAVE THE DATE!  We are planning a Parkinsons Disease educational symposium at [facility] in [place] on October 11.  More details to come!  If you would like to be added to our email list to get further information, email [email or web address].  To sign up, you can email [email or web address].  We hope to see you there!

## 2023-06-26 ENCOUNTER — Other Ambulatory Visit: Payer: Self-pay | Admitting: Neurology

## 2023-06-26 DIAGNOSIS — G20A1 Parkinson's disease without dyskinesia, without mention of fluctuations: Secondary | ICD-10-CM

## 2023-08-05 ENCOUNTER — Other Ambulatory Visit: Payer: Self-pay | Admitting: Nurse Practitioner

## 2023-08-05 DIAGNOSIS — R498 Other voice and resonance disorders: Secondary | ICD-10-CM

## 2023-08-16 ENCOUNTER — Emergency Department
Admission: EM | Admit: 2023-08-16 | Discharge: 2023-08-16 | Disposition: A | Discharge status: Home / Self Care | Payer: 59 | Attending: Emergency Medicine | Admitting: Emergency Medicine

## 2023-08-16 ENCOUNTER — Other Ambulatory Visit: Payer: 59

## 2023-08-16 ENCOUNTER — Telehealth: Payer: Self-pay | Admitting: Nurse Practitioner

## 2023-08-16 ENCOUNTER — Emergency Department: Payer: 59

## 2023-08-16 ENCOUNTER — Other Ambulatory Visit: Payer: Self-pay

## 2023-08-16 ENCOUNTER — Encounter: Payer: Self-pay | Admitting: Intensive Care

## 2023-08-16 DIAGNOSIS — R0602 Shortness of breath: Secondary | ICD-10-CM | POA: Diagnosis not present

## 2023-08-16 DIAGNOSIS — K5900 Constipation, unspecified: Secondary | ICD-10-CM | POA: Diagnosis not present

## 2023-08-16 DIAGNOSIS — R079 Chest pain, unspecified: Secondary | ICD-10-CM

## 2023-08-16 DIAGNOSIS — I10 Essential (primary) hypertension: Secondary | ICD-10-CM | POA: Diagnosis not present

## 2023-08-16 DIAGNOSIS — G20A1 Parkinson's disease without dyskinesia, without mention of fluctuations: Secondary | ICD-10-CM | POA: Diagnosis not present

## 2023-08-16 DIAGNOSIS — R0789 Other chest pain: Secondary | ICD-10-CM | POA: Diagnosis not present

## 2023-08-16 DIAGNOSIS — R0989 Other specified symptoms and signs involving the circulatory and respiratory systems: Secondary | ICD-10-CM | POA: Diagnosis not present

## 2023-08-16 DIAGNOSIS — J9811 Atelectasis: Secondary | ICD-10-CM | POA: Diagnosis not present

## 2023-08-16 DIAGNOSIS — C61 Malignant neoplasm of prostate: Secondary | ICD-10-CM | POA: Diagnosis not present

## 2023-08-16 DIAGNOSIS — R1032 Left lower quadrant pain: Secondary | ICD-10-CM | POA: Diagnosis not present

## 2023-08-16 DIAGNOSIS — R7989 Other specified abnormal findings of blood chemistry: Secondary | ICD-10-CM

## 2023-08-16 DIAGNOSIS — R778 Other specified abnormalities of plasma proteins: Secondary | ICD-10-CM | POA: Diagnosis not present

## 2023-08-16 LAB — BASIC METABOLIC PANEL
Anion gap: 12 (ref 5–15)
BUN: 19 mg/dL (ref 8–23)
CO2: 25 mmol/L (ref 22–32)
Calcium: 8.9 mg/dL (ref 8.9–10.3)
Chloride: 100 mmol/L (ref 98–111)
Creatinine, Ser: 1.53 mg/dL — ABNORMAL HIGH (ref 0.61–1.24)
GFR, Estimated: 50 mL/min — ABNORMAL LOW (ref >60)
Glucose, Bld: 129 mg/dL — ABNORMAL HIGH (ref 70–99)
Potassium: 3.5 mmol/L (ref 3.5–5.1)
Sodium: 137 mmol/L (ref 135–145)

## 2023-08-16 LAB — URINALYSIS, ROUTINE W REFLEX MICROSCOPIC
Bilirubin Urine: NEGATIVE
Glucose, UA: NEGATIVE mg/dL
Hgb urine dipstick: NEGATIVE
Ketones, ur: 5 mg/dL — AB
Leukocytes,Ua: NEGATIVE
Nitrite: NEGATIVE
Protein, ur: NEGATIVE mg/dL
Specific Gravity, Urine: 1.018 (ref 1.005–1.030)
pH: 7 (ref 5.0–8.0)

## 2023-08-16 LAB — CBC WITH DIFFERENTIAL/PLATELET
Abs Immature Granulocytes: 0.04 10*3/uL (ref 0.00–0.07)
Basophils Absolute: 0 10*3/uL (ref 0.0–0.1)
Basophils Relative: 0 %
Eosinophils Absolute: 0.1 10*3/uL (ref 0.0–0.5)
Eosinophils Relative: 1 %
HCT: 39.7 % (ref 39.0–52.0)
Hemoglobin: 12.9 g/dL — ABNORMAL LOW (ref 13.0–17.0)
Immature Granulocytes: 0 %
Lymphocytes Relative: 15 %
Lymphs Abs: 1.5 10*3/uL (ref 0.7–4.0)
MCH: 29.9 pg (ref 26.0–34.0)
MCHC: 32.5 g/dL (ref 30.0–36.0)
MCV: 91.9 fL (ref 80.0–100.0)
Monocytes Absolute: 0.7 10*3/uL (ref 0.1–1.0)
Monocytes Relative: 7 %
Neutro Abs: 7.6 10*3/uL (ref 1.7–7.7)
Neutrophils Relative %: 77 %
Platelets: 192 10*3/uL (ref 150–400)
RBC: 4.32 MIL/uL (ref 4.22–5.81)
RDW: 12.2 % (ref 11.5–15.5)
WBC: 10 10*3/uL (ref 4.0–10.5)
nRBC: 0 % (ref 0.0–0.2)

## 2023-08-16 LAB — TROPONIN I (HIGH SENSITIVITY)
Troponin I (High Sensitivity): 86 ng/L — ABNORMAL HIGH (ref <18)
Troponin I (High Sensitivity): 69 ng/L — ABNORMAL HIGH (ref <18)

## 2023-08-16 MED ORDER — AMOXICILLIN-POT CLAVULANATE 875-125 MG PO TABS: 1.0000 | ORAL_TABLET | Freq: Two times a day (BID) | ORAL | 0 refills | Status: AC

## 2023-08-16 NOTE — ED Triage Notes (Signed)
Patient c/o left sided chest pain and sob that started this AM. Reports some radiation down left arm.  The past two months he has been having left lower back pain that radiates down groin area and into thigh. Reports some tingling when urinating.   Ambulatory into triage with NAD noted

## 2023-08-16 NOTE — ED Provider Notes (Signed)
[facility] Provider Note   Event Date/Time   First MD Initiated Contact with Patient 08/16/23 1640     (approximate) History  Chest Pain and Shortness of Breath  HPI James Bush is a 62 y.o. male with a stated past medical history of hypertension, hyperlipidemia, and Parkinson's disease who presents complaining of left lower quadrant abdominal pain that occasionally radiates up into his left chest.  Patient states that this pain has been intermittent but has been more frequent over the past few weeks.  Patient states that this morning he began having left-sided chest pain and shortness of breath that was associated with the lowest left-sided pain as well. ROS: Patient currently denies any vision changes, tinnitus, difficulty speaking, facial droop, sore throat, nausea/vomiting/diarrhea, dysuria, or weakness/numbness/paresthesias in any extremity   Physical Exam  Triage Vital Signs: ED Triage Vitals  Encounter Vitals Group     BP 08/16/23 1014 (!) 138/99     Systolic BP Percentile --      Diastolic BP Percentile --      Pulse Rate 08/16/23 1014 (!) 104     Resp 08/16/23 1014 16     Temp 08/16/23 1014 97.7 F (36.5 C)     Temp Source 08/16/23 1014 Oral     SpO2 08/16/23 1014 95 %     Weight 08/16/23 1015 227 lb 14.4 oz (103.4 kg)     Height 08/16/23 1015 5\' 10"  (1.778 m)     Head Circumference --      Peak Flow --      Pain Score 08/16/23 1015 3     Pain Loc --      Pain Education --      Exclude from Growth Chart --    Most recent vital signs: Vitals:   08/16/23 1014 08/16/23 1336  BP: (!) 138/99 134/84  Pulse: (!) 104 89  Resp: 16 16  Temp: 97.7 F (36.5 C) 98.3 F (36.8 C)  SpO2: 95% 94%   General: Awake, oriented x4. CV:  Good peripheral perfusion.  Resp:  Normal effort.  Abd:  No distention.  Other:  Elderly overweight Caucasian male resting comfortably in no acute distress ED Results / Procedures / Treatments  Labs (all labs ordered  are listed, but only abnormal results are displayed) Labs Reviewed  BASIC METABOLIC PANEL - Abnormal; Notable for the following components:      Result Value   Glucose, Bld 129 (*)    Creatinine, Ser 1.53 (*)    GFR, Estimated 50 (*)    All other components within normal limits  CBC WITH DIFFERENTIAL/PLATELET - Abnormal; Notable for the following components:   Hemoglobin 12.9 (*)    All other components within normal limits  URINALYSIS, ROUTINE W REFLEX MICROSCOPIC - Abnormal; Notable for the following components:   Color, Urine YELLOW (*)    APPearance CLEAR (*)    Ketones, ur 5 (*)    All other components within normal limits  TROPONIN I (HIGH SENSITIVITY) - Abnormal; Notable for the following components:   Troponin I (High Sensitivity) 86 (*)    All other components within normal limits  TROPONIN I (HIGH SENSITIVITY) - Abnormal; Notable for the following components:   Troponin I (High Sensitivity) 69 (*)    All other components within normal limits   EKG ED ECG REPORT I, Merwyn Katos, the attending physician, personally viewed and interpreted this ECG. Date: 08/16/2023 EKG Time: 1018 Rate: 94 Rhythm: normal sinus  rhythm QRS Axis: normal Intervals: normal ST/T Wave abnormalities: normal Narrative Interpretation: no evidence of acute ischemia RADIOLOGY ED MD interpretation: 2 view chest x-ray interpreted independently and shows low lung volumes with mild left basilar atelectasis -Agree with radiology assessment Official radiology report(s): DG Chest 2 View Result Date: 08/16/2023 CLINICAL DATA:  Chest pain. EXAM: CHEST - 2 VIEW COMPARISON:  None Available. FINDINGS: Low lung volumes. The heart size and mediastinal contours are within normal limits. Mild left basilar atelectasis. No focal consolidation, pleural effusion, or pneumothorax. No acute osseous abnormality. IMPRESSION: Low lung volumes. Mild left basilar atelectasis. Otherwise, no acute cardiopulmonary findings.  Electronically Signed   By: Hart Robinsons M.D.   On: 08/16/2023 11:11   PROCEDURES: Critical Care performed: No Procedures MEDICATIONS ORDERED IN ED: Medications - No data to display IMPRESSION / MDM / ASSESSMENT AND PLAN / ED COURSE  I reviewed the triage vital signs and the nursing notes.                             The patient is on the cardiac monitor to evaluate for evidence of arrhythmia and/or significant heart rate changes. Patient's presentation is most consistent with acute presentation with potential threat to life or bodily function. Patient presents for abdominal pain.  Differential diagnosis includes appendicitis, abdominal aortic aneurysm, surgical biliary disease, pancreatitis, SBO, mesenteric ischemia, serious intra-abdominal bacterial illness, genital torsion. Doubt atypical ACS. Based on history, physical exam, radiologic/laboratory evaluation, there is no red flag results or symptomatology requiring emergent intervention or need for admission at this time Pt tolerating PO. Disposition: Patient will be discharged with strict return precautions and follow up with primary MD within 12-24 hours for further evaluation. Patient understands that this still may have an early presentation of an emergent medical condition such as appendicitis that will require a recheck.   FINAL CLINICAL IMPRESSION(S) / ED DIAGNOSES   Final diagnoses:  Left lower quadrant abdominal pain  Constipation, unspecified constipation type   Rx / DC Orders   ED Discharge Orders     None      Note:  This document was prepared using Dragon voice recognition software and may include unintentional dictation errors.   Merwyn Katos, MD 08/16/23 [phone]

## 2023-08-16 NOTE — Telephone Encounter (Signed)
Patient's wife called to schedule appointment for patient today. Leg pain since October. I explained we have no available appointments for today, per Nim[place], patient to go to [facility]

## 2023-08-16 NOTE — ED Provider Triage Note (Signed)
Emergency Medicine Provider Triage Evaluation Note  James Bush , a 62 y.o. male  was evaluated in triage.  Pt complains of new chest pain and SOB. Has had groin pain, flank pain and radiating back pain down the leg for 2 months.  Review of Systems  Positive: CP, SOB, flank pain Negative:   Physical Exam  There were no vitals taken for this visit. Gen:   Awake, no distress   Resp:  Normal effort  MSK:   Moves extremities without difficulty  Other:    Medical Decision Making  Medically screening exam initiated at 10:11 AM.  Appropriate orders placed.  Mat Carne was informed that the remainder of the evaluation will be completed by another provider, this initial triage assessment does not replace that evaluation, and the importance of remaining in the ED until their evaluation is complete.     Cameron Ali, PA-C 08/16/23 1015

## 2023-08-17 LAB — PSA TOTAL (REFLEX TO FREE): Prostate Specific Ag, Serum: 13.4 ng/mL — ABNORMAL HIGH (ref 0.0–4.0)

## 2023-08-18 ENCOUNTER — Ambulatory Visit: Payer: 59

## 2023-08-19 ENCOUNTER — Ambulatory Visit: Payer: 59 | Admitting: Physician Assistant

## 2023-08-19 ENCOUNTER — Ambulatory Visit: Payer: 59 | Admitting: Urology

## 2023-09-06 ENCOUNTER — Ambulatory Visit: Payer: 59 | Admitting: Physician Assistant

## 2023-09-13 ENCOUNTER — Other Ambulatory Visit: Payer: Self-pay | Admitting: Nurse Practitioner

## 2023-09-13 ENCOUNTER — Other Ambulatory Visit: Payer: Self-pay | Admitting: Neurology

## 2023-09-13 DIAGNOSIS — E782 Mixed hyperlipidemia: Secondary | ICD-10-CM

## 2023-09-13 DIAGNOSIS — G20A1 Parkinson's disease without dyskinesia, without mention of fluctuations: Secondary | ICD-10-CM

## 2023-09-23 ENCOUNTER — Other Ambulatory Visit: Payer: Self-pay | Admitting: Nurse Practitioner

## 2023-09-23 DIAGNOSIS — I10 Essential (primary) hypertension: Secondary | ICD-10-CM

## 2023-09-30 ENCOUNTER — Ambulatory Visit: Payer: 59 | Admitting: Physician Assistant

## 2023-09-30 ENCOUNTER — Ambulatory Visit: Payer: 59 | Admitting: Urology

## 2023-09-30 VITALS — BP 122/80 | HR 78 | Temp 98.0°F | Resp 16 | Ht 70.0 in | Wt 227.0 lb

## 2023-09-30 VITALS — BP 116/78 | HR 98 | Ht 70.0 in | Wt 226.0 lb

## 2023-09-30 DIAGNOSIS — I10 Essential (primary) hypertension: Secondary | ICD-10-CM

## 2023-09-30 DIAGNOSIS — E66811 Obesity, class 1: Secondary | ICD-10-CM | POA: Diagnosis not present

## 2023-09-30 DIAGNOSIS — G4733 Obstructive sleep apnea (adult) (pediatric): Secondary | ICD-10-CM | POA: Diagnosis not present

## 2023-09-30 DIAGNOSIS — Z7189 Other specified counseling: Secondary | ICD-10-CM | POA: Diagnosis not present

## 2023-09-30 DIAGNOSIS — C61 Malignant neoplasm of prostate: Secondary | ICD-10-CM | POA: Diagnosis not present

## 2023-09-30 DIAGNOSIS — R310 Gross hematuria: Secondary | ICD-10-CM

## 2023-09-30 DIAGNOSIS — N529 Male erectile dysfunction, unspecified: Secondary | ICD-10-CM | POA: Diagnosis not present

## 2023-09-30 LAB — URINALYSIS, COMPLETE
Bilirubin, UA: NEGATIVE
Glucose, UA: NEGATIVE
Leukocytes,UA: NEGATIVE
Nitrite, UA: NEGATIVE
Protein,UA: NEGATIVE
RBC, UA: NEGATIVE
Specific Gravity, UA: 1.025 (ref 1.005–1.030)
Urobilinogen, Ur: 0.2 mg/dL (ref 0.2–1.0)
pH, UA: 6.5 (ref 5.0–7.5)

## 2023-09-30 LAB — MICROSCOPIC EXAMINATION

## 2023-09-30 MED ORDER — TADALAFIL 20 MG PO TABS: 20.0000 mg | ORAL_TABLET | Freq: Every day | ORAL | 9 refills | Status: DC | PRN

## 2023-09-30 NOTE — Progress Notes (Signed)
[facility] [address] [place], [place] [address]  Pulmonary Sleep Medicine   Office Visit Note  Patient Name: James Bush DOB: [birth year] MRN [id number]  Date of Service: 09/30/2023  Complaints/HPI: Pt is here for routine follow up for OSA on CPAP. Wearing nightly and is benefiting from use. Denies any headaches. Cleaning and changing out supplies. Does have some dryness and discussed adjusting humidity. Unfortunately no download available and pt will look to see which DME company he uses so we can request this.   ROS  General: (-) fever, (-) chills, (-) night sweats, (-) weakness Skin: (-) rashes, (-) itching,. Eyes: (-) visual changes, (-) redness, (-) itching. Nose and Sinuses: (-) nasal stuffiness or itchiness, (-) postnasal drip, (-) nosebleeds, (-) sinus trouble. Mouth and Throat: (-) sore throat, (-) hoarseness. Neck: (-) swollen glands, (-) enlarged thyroid, (-) neck pain. Respiratory: - cough, (-) bloody sputum, - shortness of breath, - wheezing. Cardiovascular: - ankle swelling, (-) chest pain. Lymphatic: (-) lymph node enlargement. Neurologic: (-) numbness, (-) tingling. Psychiatric: (-) anxiety, (-) depression   Current Medication: Outpatient Encounter Medications as of 09/30/2023  Medication Sig   albuterol (VENTOLIN HFA) 108 (90 Base) MCG/ACT inhaler Inhale 2 puffs into the lungs every 6 (six) hours as needed for wheezing or shortness of breath.   amLODipine (NORVASC) 5 MG tablet Take 1 tablet (5 mg total) by mouth daily.   aspirin 81 MG tablet Take 81 mg by mouth daily.   carbidopa-levodopa (SINEMET CR) 50-200 MG tablet Take 1 tablet by mouth at bedtime.   carbidopa-levodopa (SINEMET IR) 25-100 MG tablet TAKE 2 TABLETS AT 5AM, 2 AT 9AM, 2 AT 2PM AND ONE AT 5PM   clonazePAM (KLONOPIN) 0.5 MG tablet Take 0.5 tablets (0.25 mg total) by mouth at bedtime.   losartan-hydrochlorothiazide (HYZAAR) 50-12.5 MG tablet TAKE 1 TABLET BY MOUTH EVERY DAY    Multiple Vitamin (MULTIVITAMIN WITH MINERALS) TABS tablet Take 1 tablet by mouth daily.   rosuvastatin (CRESTOR) 10 MG tablet TAKE 1 TABLET BY MOUTH EVERY DAY   tadalafil (CIALIS) 20 MG tablet Take 1 tablet (20 mg total) by mouth daily as needed for erectile dysfunction.   No facility-administered encounter medications on file as of 09/30/2023.    Surgical History: Past Surgical History:  Procedure Laterality Date   CARDIAC ELECTROPHYSIOLOGY STUDY AND ABLATION     COLONOSCOPY N/A 03/20/2021   Procedure: COLONOSCOPY;  Surgeon: Wyline Mood, MD;  Location: [facility] ENDOSCOPY;  Service: Gastroenterology;  Laterality: N/A;   EYE SURGERY     strabismus as child   KNEE ARTHROPLASTY Right     Medical History: Past Medical History:  Diagnosis Date   Asthma    Heart murmur    Hyperlipidemia    Hypertension    Parkinson disease (HCC)     Family History: Family History  Problem Relation Age of Onset   COPD Mother    Multiple sclerosis Son    Healthy Daughter    Healthy Daughter    Hematuria Neg Hx    Kidney cancer Neg Hx    Prostate cancer Neg Hx     Social History: Social History   Socioeconomic History   Marital status: Married    Spouse name: Not on file   Number of children: Not on file   Years of education: Not on file   Highest education level: Not on file  Occupational History   Not on file  Tobacco Use   Smoking status: Never  Passive exposure: Never   Smokeless tobacco: Never  Vaping Use   Vaping status: Never Used  Substance and Sexual Activity   Alcohol use: Not Currently    Alcohol/week: 1.0 standard drink of alcohol    Types: 1 Cans of beer per week   Drug use: No   Sexual activity: Not on file  Other Topics Concern   Not on file  Social History Narrative   Right Handed    Lives in a one story home   Social Drivers of Health   Financial Resource Strain: Not on file  Food Insecurity: Not on file  Transportation Needs: Not on file  Physical  Activity: Not on file  Stress: Not on file  Social Connections: Not on file  Intimate Partner Violence: Not on file    Vital Signs: Blood pressure 122/80, pulse 78, temperature 98 F (36.7 C), resp. rate 16, height 5\' 10"  (1.778 m), weight 227 lb (103 kg), SpO2 97%.  Examination: General Appearance: The patient is well-developed, well-nourished, and in no distress. Skin: Gross inspection of skin unremarkable. Head: normocephalic, no gross deformities. Eyes: no gross deformities noted. ENT: ears appear grossly normal no exudates. Neck: Supple. No thyromegaly. No LAD. Respiratory: Lungs clear to auscultation. Cardiovascular: Normal S1 and S2 without murmur or rub. Extremities: No cyanosis. pulses are equal. Neurologic: Alert and oriented. No involuntary movements.  LABS: Recent Results (from the past 2160 hours)  PSA Total (Reflex To Free)     Status: Abnormal   Collection Time: 08/16/23  8:06 AM  Result Value Ref Range   Prostate Specific Ag, Serum 13.4 (H) 0.0 - 4.0 ng/mL    Comment: Roche ECLIA methodology. According to the American Urological Association, Serum PSA should decrease and remain at undetectable levels after radical prostatectomy. The AUA defines biochemical recurrence as an initial PSA value 0.2 ng/mL or greater followed by a subsequent confirmatory PSA value 0.2 ng/mL or greater. Values obtained with different assay methods or kits cannot be used interchangeably. Results cannot be interpreted as absolute evidence of the presence or absence of malignant disease.    Reflex Criteria Comment     Comment: The percent free PSA is performed on a reflex basis only when the total PSA is between 4.0 and 10.0 ng/mL.   Basic metabolic panel     Status: Abnormal   Collection Time: 08/16/23 10:17 AM  Result Value Ref Range   Sodium 137 135 - 145 mmol/L   Potassium 3.5 3.5 - 5.1 mmol/L   Chloride 100 98 - 111 mmol/L   CO2 25 22 - 32 mmol/L   Glucose, Bld 129 (H) 70 -  99 mg/dL    Comment: Glucose reference range applies only to samples taken after fasting for at least 8 hours.   BUN 19 8 - 23 mg/dL   Creatinine, Ser 1.61 (H) 0.61 - 1.24 mg/dL   Calcium 8.9 8.9 - 09.6 mg/dL   GFR, Estimated 50 (L) >60 mL/min    Comment: (NOTE) Calculated using the CKD-EPI Creatinine Equation (2021)    Anion gap 12 5 - 15    Comment: Performed at [facility], [address] [place], [place] [address]  CBC with Differential     Status: Abnormal   Collection Time: 08/16/23 10:17 AM  Result Value Ref Range   WBC 10.0 4.0 - 10.5 K/uL   RBC 4.32 4.22 - 5.81 MIL/uL   Hemoglobin 12.9 (L) 13.0 - 17.0 g/dL   HCT 98.1 19.1 - 47.8 %  MCV 91.9 80.0 - 100.0 fL   MCH 29.9 26.0 - 34.0 pg   MCHC 32.5 30.0 - 36.0 g/dL   RDW 78.2 95.6 - 21.3 %   Platelets 192 150 - 400 K/uL   nRBC 0.0 0.0 - 0.2 %   Neutrophils Relative % 77 %   Neutro Abs 7.6 1.7 - 7.7 K/uL   Lymphocytes Relative 15 %   Lymphs Abs 1.5 0.7 - 4.0 K/uL   Monocytes Relative 7 %   Monocytes Absolute 0.7 0.1 - 1.0 K/uL   Eosinophils Relative 1 %   Eosinophils Absolute 0.1 0.0 - 0.5 K/uL   Basophils Relative 0 %   Basophils Absolute 0.0 0.0 - 0.1 K/uL   Immature Granulocytes 0 %   Abs Immature Granulocytes 0.04 0.00 - 0.07 K/uL    Comment: Performed at [facility], [address], [place], [place] [address]  Troponin I (High Sensitivity)     Status: Abnormal   Collection Time: 08/16/23 10:17 AM  Result Value Ref Range   Troponin I (High Sensitivity) 86 (H) <18 ng/L    Comment: (NOTE) Elevated high sensitivity troponin I (hsTnI) values and significant  changes across serial measurements may suggest ACS but many other  chronic and acute conditions are known to elevate hsTnI results.  Refer to the "Links" section for chest pain algorithms and additional  guidance. Performed at [facility], [address] [place], [place] [address]   Urinalysis, Routine w reflex microscopic  -Urine, Clean Catch     Status: Abnormal   Collection Time: 08/16/23 10:17 AM  Result Value Ref Range   Color, Urine YELLOW (A) YELLOW   APPearance CLEAR (A) CLEAR   Specific Gravity, Urine 1.018 1.005 - 1.030   pH 7.0 5.0 - 8.0   Glucose, UA NEGATIVE NEGATIVE mg/dL   Hgb urine dipstick NEGATIVE NEGATIVE   Bilirubin Urine NEGATIVE NEGATIVE   Ketones, ur 5 (A) NEGATIVE mg/dL   Protein, ur NEGATIVE NEGATIVE mg/dL   Nitrite NEGATIVE NEGATIVE   Leukocytes,Ua NEGATIVE NEGATIVE    Comment: Performed at [facility], [address], [place], [place] [address]  Troponin I (High Sensitivity)     Status: Abnormal   Collection Time: 08/16/23  2:25 PM  Result Value Ref Range   Troponin I (High Sensitivity) 69 (H) <18 ng/L    Comment: (NOTE) Elevated high sensitivity troponin I (hsTnI) values and significant  changes across serial measurements may suggest ACS but many other  chronic and acute conditions are known to elevate hsTnI results.  Refer to the "Links" section for chest pain algorithms and additional  guidance. Performed at [facility], [address] [place], [place] [address]     Radiology: DG Chest 2 View Result Date: 08/16/2023 CLINICAL DATA:  Chest pain. EXAM: CHEST - 2 VIEW COMPARISON:  None Available. FINDINGS: Low lung volumes. The heart size and mediastinal contours are within normal limits. Mild left basilar atelectasis. No focal consolidation, pleural effusion, or pneumothorax. No acute osseous abnormality. IMPRESSION: Low lung volumes. Mild left basilar atelectasis. Otherwise, no acute cardiopulmonary findings. Electronically Signed   By: Hart Robinsons M.D.   On: 08/16/2023 11:11    No results found.  No results found.    Assessment and Plan: Patient Active Problem List   Diagnosis Date Noted   OSA (obstructive sleep apnea) 03/14/2021   Other hypersomnia 03/14/2021   Left ventricular hypokinesis 05/06/2020   Coronary artery disease due  to lipid rich plaque  05/06/2020   Hypertension, poor control 10/20/2018   Dyspnea on exertion 08/18/2018   Parkinson's disease (HCC) 04/14/2018   Tremor 03/17/2018   Essential hypertension 02/10/2018   Hyperlipidemia 02/10/2018   Encounter for general adult medical examination with abnormal findings 02/10/2018   Right-sided muscle weakness 02/10/2018   Dysuria 02/10/2018    1. OSA (obstructive sleep apnea) (Primary) Continue nightly use  2. CPAP use counseling CPAP couseling-Discussed importance of adequate CPAP use as well as proper care and cleaning techniques of machine and all supplies.  3. Essential hypertension Continue current medication and f/u with PCP.  4. Obesity (BMI 30.0-34.9) Obesity Counseling: Had a lengthy discussion regarding patients BMI and weight issues. Patient was instructed on portion control as well as increased activity. Also discussed caloric restrictions with trying to maintain intake less than 2000 Kcal. Discussions were made in accordance with the 5As of weight management. Simple actions such as not eating late and if able to, taking a walk is suggested.    General Counseling: I have discussed the findings of the evaluation and examination with Francee Piccolo.  I have also discussed any further diagnostic evaluation thatmay be needed or ordered today. Cyler verbalizes understanding of the findings of todays visit. We also reviewed his medications today and discussed drug interactions and side effects including but not limited excessive drowsiness and altered mental states. We also discussed that there is always a risk not just to him but also people around him. he has been encouraged to call the office with any questions or concerns that should arise related to todays visit.  No orders of the defined types were placed in this encounter.    Time spent: 30  I have personally obtained a history, examined the patient, evaluated laboratory and imaging results,  formulated the assessment and plan and placed orders. This patient was seen by Lynn Ito, PA-C in collaboration with Dr. Freda Munro as a part of collaborative care agreement.     Yevonne Pax, MD [facility]monary and Critical [facility] Sleep medicine

## 2023-09-30 NOTE — Progress Notes (Signed)
   09/30/2023 2:16 PM   James Bush 26-Oct-1960 [id number]  Reason for visit: Follow up low risk prostate cancer on active surveillance, lower urinary symptoms, ED  HPI: 63 year old African-American male with Parkinson's disease who was diagnosed with very low risk prostate cancer in March 2017 with a PSA of 6.25 and biopsy showed 1/12 cores positive for Gleason score 3+3= 6 disease.  Confirmatory biopsy in October 2017 showed 2/12 cores of 3+3=6 disease, with max core involvement of 33%.  Prostate volume was 53 g, with PSA density of 0.13.   PSA was relatively stable ranging from 6.7-8.7 from 2019 through 2022, but has been slightly elevated over the last few years including 11.2 in October 2023, 10.01 July 2022, 9.01 February 2023.  PSA was checked 08/16/23 at time of acute ER visit for abdominal pain and was 13.4.  DRE historically has been asymmetric.  He also has a history of mild urinary symptoms with frequency and urgency, though symptoms have improved by cutting back on diet sodas.  He reports an episode of hematuria a few weeks ago associated with some left-sided flank pain and he feels he may have passed a kidney stone.  He still has some occasional left-sided flank pain.  I recommended checking a urinalysis today, and if microscopic hematuria would recommend CT and potentially cystoscopy.  Using Cialis 20 mg on demand for ED with moderate results, I offered trial of Viagra but he would like to stick with the Cialis.  Urinalysis and culture today, call with results, if microscopic hematuria recommend CT urogram and consider cystoscopy Repeat PSA reflex to free in 3 months-> consider prostate MRI if remains significantly elevated Cialis refilled   Sondra Come, MD  [facility] [address], [address] [place], [place] [address] [phone]

## 2023-09-30 NOTE — Addendum Note (Signed)
Addended by: Frankey Shown on: 09/30/2023 02:44 PM   Modules accepted: Orders

## 2023-10-02 LAB — CULTURE, URINE COMPREHENSIVE

## 2023-10-04 ENCOUNTER — Ambulatory Visit (INDEPENDENT_AMBULATORY_CARE_PROVIDER_SITE_OTHER): Payer: 59 | Admitting: Nurse Practitioner

## 2023-10-04 ENCOUNTER — Encounter: Payer: Self-pay | Admitting: Nurse Practitioner

## 2023-10-04 VITALS — BP 138/88 | HR 77 | Temp 98.2°F | Resp 16 | Ht 70.0 in | Wt 224.0 lb

## 2023-10-04 DIAGNOSIS — I10 Essential (primary) hypertension: Secondary | ICD-10-CM

## 2023-10-04 DIAGNOSIS — E782 Mixed hyperlipidemia: Secondary | ICD-10-CM

## 2023-10-04 DIAGNOSIS — R7989 Other specified abnormal findings of blood chemistry: Secondary | ICD-10-CM | POA: Diagnosis not present

## 2023-10-04 DIAGNOSIS — G20B2 Parkinson's disease with dyskinesia, with fluctuations: Secondary | ICD-10-CM | POA: Diagnosis not present

## 2023-10-04 DIAGNOSIS — D649 Anemia, unspecified: Secondary | ICD-10-CM | POA: Diagnosis not present

## 2023-10-04 MED ORDER — ROSUVASTATIN CALCIUM 10 MG PO TABS: 10.0000 mg | ORAL_TABLET | Freq: Every day | ORAL | 1 refills | Status: DC

## 2023-10-04 MED ORDER — LOSARTAN POTASSIUM-HCTZ 50-12.5 MG PO TABS: 1.0000 | ORAL_TABLET | Freq: Every day | ORAL | 1 refills | Status: DC

## 2023-10-04 MED ORDER — AMLODIPINE BESYLATE 5 MG PO TABS: 5.0000 mg | ORAL_TABLET | Freq: Every day | ORAL | 1 refills | Status: DC

## 2023-10-04 NOTE — Progress Notes (Signed)
[facility] [address] [place], [place] [address]  Internal MEDICINE  Office Visit Note  Patient Name: James Bush  [id number]  [id number]  Date of Service: 10/04/2023  Chief Complaint  Patient presents with   Hypertension   Hyperlipidemia   Follow-up    HPI James Bush presents for a follow-up visit for parkinsons, hypertension, high cholesterol and labs. Parkinsons -- sees neurology, takes carbidopa-levodopa, he reports that they increased his dose recently. tremors are minimal today, voice sound steady and at normal volume. Reports that his speech is slurred in the mornings sometimes but he notices improvement after he takes his medication.  Hypertension -- controlled on current medications, due for refills.  High cholesterol -- started rosuvastatin last year when cholesterol was noted to increased. Due for repeat cholesterol panel.  Went to ER in December -- labs showed mild anemia and elevated creatinine -- need repeat labs  Elevated PSA -- seeing urology, had recent visit in January. Repeat PSA was ordered for him to have done in late April.      Current Medication: Outpatient Encounter Medications as of 10/04/2023  Medication Sig   albuterol (VENTOLIN HFA) 108 (90 Base) MCG/ACT inhaler Inhale 2 puffs into the lungs every 6 (six) hours as needed for wheezing or shortness of breath.   aspirin 81 MG tablet Take 81 mg by mouth daily.   carbidopa-levodopa (SINEMET CR) 50-200 MG tablet Take 1 tablet by mouth at bedtime.   carbidopa-levodopa (SINEMET IR) 25-100 MG tablet TAKE 2 TABLETS AT 5AM, 2 AT 9AM, 2 AT 2PM AND ONE AT 5PM   clonazePAM (KLONOPIN) 0.5 MG tablet Take 0.5 tablets (0.25 mg total) by mouth at bedtime.   latanoprost (XALATAN) 0.005 % ophthalmic solution SMARTSIG:1 Drop(s) In Eye(s) Every Evening   Multiple Vitamin (MULTIVITAMIN WITH MINERALS) TABS tablet Take 1 tablet by mouth daily.   tadalafil (CIALIS) 20 MG tablet Take 1 tablet (20 mg total) by mouth daily  as needed for erectile dysfunction.   [DISCONTINUED] amLODipine (NORVASC) 5 MG tablet Take 1 tablet (5 mg total) by mouth daily.   [DISCONTINUED] losartan-hydrochlorothiazide (HYZAAR) 50-12.5 MG tablet TAKE 1 TABLET BY MOUTH EVERY DAY   [DISCONTINUED] rosuvastatin (CRESTOR) 10 MG tablet TAKE 1 TABLET BY MOUTH EVERY DAY   amLODipine (NORVASC) 5 MG tablet Take 1 tablet (5 mg total) by mouth daily.   losartan-hydrochlorothiazide (HYZAAR) 50-12.5 MG tablet Take 1 tablet by mouth daily.   rosuvastatin (CRESTOR) 10 MG tablet Take 1 tablet (10 mg total) by mouth daily.   No facility-administered encounter medications on file as of 10/04/2023.    Surgical History: Past Surgical History:  Procedure Laterality Date   CARDIAC ELECTROPHYSIOLOGY STUDY AND ABLATION     COLONOSCOPY N/A 03/20/2021   Procedure: COLONOSCOPY;  Surgeon: Wyline Mood, MD;  Location: [facility] ENDOSCOPY;  Service: Gastroenterology;  Laterality: N/A;   EYE SURGERY     strabismus as child   KNEE ARTHROPLASTY Right     Medical History: Past Medical History:  Diagnosis Date   Asthma    Heart murmur    Hyperlipidemia    Hypertension    Parkinson disease (HCC)     Family History: Family History  Problem Relation Age of Onset   COPD Mother    Multiple sclerosis Son    Healthy Daughter    Healthy Daughter    Hematuria Neg Hx    Kidney cancer Neg Hx    Prostate cancer Neg Hx     Social History  Socioeconomic History   Marital status: Married    Spouse name: Not on file   Number of children: Not on file   Years of education: Not on file   Highest education level: Not on file  Occupational History   Not on file  Tobacco Use   Smoking status: Never    Passive exposure: Never   Smokeless tobacco: Never  Vaping Use   Vaping status: Never Used  Substance and Sexual Activity   Alcohol use: Not Currently    Alcohol/week: 1.0 standard drink of alcohol    Types: 1 Cans of beer per week   Drug use: No   Sexual  activity: Not on file  Other Topics Concern   Not on file  Social History Narrative   Right Handed    Lives in a one story home   Social Drivers of Health   Financial Resource Strain: Not on file  Food Insecurity: Not on file  Transportation Needs: Not on file  Physical Activity: Not on file  Stress: Not on file  Social Connections: Not on file  Intimate Partner Violence: Not on file      Review of Systems  Constitutional:  Negative for chills, fatigue and unexpected weight change.  HENT:  Negative for congestion, rhinorrhea, sneezing and sore throat.   Eyes:  Negative for redness.  Respiratory: Negative.  Negative for cough, chest tightness and shortness of breath.   Cardiovascular: Negative.  Negative for chest pain and palpitations.  Gastrointestinal:  Negative for abdominal pain, constipation, diarrhea, nausea and vomiting.  Genitourinary:  Negative for dysuria and frequency.  Musculoskeletal:  Positive for gait problem. Negative for arthralgias, back pain, joint swelling and neck pain.  Skin:  Negative for rash.  Neurological:  Positive for tremors. Negative for numbness.  Hematological:  Negative for adenopathy. Does not bruise/bleed easily.  Psychiatric/Behavioral:  Negative for behavioral problems (Depression), sleep disturbance and suicidal ideas. The patient is not nervous/anxious.     Vital Signs: BP 138/88   Pulse 77   Temp 98.2 F (36.8 C)   Resp 16   Ht 5\' 10"  (1.778 m)   Wt 224 lb (101.6 kg)   SpO2 95%   BMI 32.14 kg/m    Physical Exam Vitals reviewed.  Constitutional:      General: He is not in acute distress.    Appearance: Normal appearance. He is obese. He is not ill-appearing.  HENT:     Head: Normocephalic and atraumatic.  Eyes:     Pupils: Pupils are equal, round, and reactive to light.  Cardiovascular:     Rate and Rhythm: Normal rate and regular rhythm.     Heart sounds: Normal heart sounds. No murmur heard. Pulmonary:     Effort:  Pulmonary effort is normal. No respiratory distress.     Breath sounds: Normal breath sounds. No wheezing.  Neurological:     Mental Status: He is alert and oriented to person, place, and time.     Gait: Gait abnormal.  Psychiatric:        Mood and Affect: Mood normal. Affect is flat (sometimes, or masked).        Behavior: Behavior normal.        Assessment/Plan: 1. Essential hypertension (Primary) Stable, continue losartan-hydrochlorothiazide and amlodipine as prescribed.  - losartan-hydrochlorothiazide (HYZAAR) 50-12.5 MG tablet; Take 1 tablet by mouth daily.  Dispense: 90 tablet; Refill: 1 - amLODipine (NORVASC) 5 MG tablet; Take 1 tablet (5 mg total) by mouth daily.  Dispense: 90 tablet; Refill: 1  2. Mixed hyperlipidemia Continue rosuvastatin as prescribed, repeat labs ordered.  - rosuvastatin (CRESTOR) 10 MG tablet; Take 1 tablet (10 mg total) by mouth daily.  Dispense: 90 tablet; Refill: 1 - CBC with Differential/Platelet - CMP14+EGFR - Lipid Profile  3. Parkinson's disease with dyskinesia and fluctuating manifestations (HCC) Seeing neurology, taking carbidopa-levodopa, symptoms have improved with increased dose.   4. Elevated serum creatinine Repeat CMP ordered  - CMP14+EGFR  5. Mild anemia Repeat CBC ordered  - CBC with Differential/Platelet   General Counseling: Monico verbalizes understanding of the findings of todays visit and agrees with plan of treatment. I have discussed any further diagnostic evaluation that may be needed or ordered today. We also reviewed his medications today. he has been encouraged to call the office with any questions or concerns that should arise related to todays visit.    Orders Placed This Encounter  Procedures   CBC with Differential/Platelet   CMP14+EGFR   Lipid Profile    Meds ordered this encounter  Medications   losartan-hydrochlorothiazide (HYZAAR) 50-12.5 MG tablet    Sig: Take 1 tablet by mouth daily.    Dispense:   90 tablet    Refill:  1    Please fill as 90 day supply   amLODipine (NORVASC) 5 MG tablet    Sig: Take 1 tablet (5 mg total) by mouth daily.    Dispense:  90 tablet    Refill:  1   rosuvastatin (CRESTOR) 10 MG tablet    Sig: Take 1 tablet (10 mg total) by mouth daily.    Dispense:  90 tablet    Refill:  1    Please fill as 90 day supply    Return for keep AWV on 8/7, please move 7/31 visit to [place] in july with lauren for pulmonary .   Total time spent:30 Minutes Time spent includes review of chart, medications, test results, and follow up plan with the patient.   [place] Controlled Substance Database was reviewed by me.  This patient was seen by Sallyanne Kuster, FNP-C in collaboration with Dr. Beverely Risen as a part of collaborative care agreement.   Troye Hiemstra R. Tedd Sias, MSN, FNP-C Internal medicine

## 2023-10-25 ENCOUNTER — Telehealth: Payer: Self-pay | Admitting: Nurse Practitioner

## 2023-10-25 NOTE — Telephone Encounter (Signed)
 04/02/23-Present MR faxed to [employer]; [phone]

## 2023-10-28 ENCOUNTER — Other Ambulatory Visit: Payer: Self-pay | Admitting: Nurse Practitioner

## 2023-10-28 DIAGNOSIS — R498 Other voice and resonance disorders: Secondary | ICD-10-CM

## 2023-10-29 ENCOUNTER — Telehealth: Payer: Self-pay | Admitting: Neurology

## 2023-10-29 DIAGNOSIS — D649 Anemia, unspecified: Secondary | ICD-10-CM | POA: Diagnosis not present

## 2023-10-29 DIAGNOSIS — E782 Mixed hyperlipidemia: Secondary | ICD-10-CM | POA: Diagnosis not present

## 2023-10-29 DIAGNOSIS — R7989 Other specified abnormal findings of blood chemistry: Secondary | ICD-10-CM | POA: Diagnosis not present

## 2023-10-29 NOTE — Telephone Encounter (Signed)
 Pt/lawyer sent disability forms.  There is a FCE in this and we don't do FCE's here.  We can send the patient for an FCE but we don't do these.

## 2023-10-29 NOTE — Telephone Encounter (Signed)
 error

## 2023-10-30 LAB — CBC WITH DIFFERENTIAL/PLATELET
Basophils Absolute: 0.1 10*3/uL (ref 0.0–0.2)
Basos: 1 %
EOS (ABSOLUTE): 0.1 10*3/uL (ref 0.0–0.4)
Eos: 2 %
Hematocrit: 39.7 % (ref 37.5–51.0)
Hemoglobin: 13 g/dL (ref 13.0–17.7)
Immature Grans (Abs): 0 10*3/uL (ref 0.0–0.1)
Immature Granulocytes: 0 %
Lymphocytes Absolute: 1.9 10*3/uL (ref 0.7–3.1)
Lymphs: 30 %
MCH: 30.2 pg (ref 26.6–33.0)
MCHC: 32.7 g/dL (ref 31.5–35.7)
MCV: 92 fL (ref 79–97)
Monocytes Absolute: 0.6 10*3/uL (ref 0.1–0.9)
Monocytes: 9 %
Neutrophils Absolute: 3.8 10*3/uL (ref 1.4–7.0)
Neutrophils: 58 %
Platelets: 273 10*3/uL (ref 150–450)
RBC: 4.3 x10E6/uL (ref 4.14–5.80)
RDW: 12.7 % (ref 11.6–15.4)
WBC: 6.4 10*3/uL (ref 3.4–10.8)

## 2023-10-30 LAB — CMP14+EGFR
ALT: 11 IU/L (ref 0–44)
AST: 17 IU/L (ref 0–40)
Albumin: 4.3 g/dL (ref 3.9–4.9)
Alkaline Phosphatase: 54 IU/L (ref 44–121)
BUN/Creatinine Ratio: 19 (ref 10–24)
BUN: 15 mg/dL (ref 8–27)
Bilirubin Total: 0.4 mg/dL (ref 0.0–1.2)
CO2: 24 mmol/L (ref 20–29)
Calcium: 9.3 mg/dL (ref 8.6–10.2)
Chloride: 105 mmol/L (ref 96–106)
Creatinine, Ser: 0.77 mg/dL (ref 0.76–1.27)
Globulin, Total: 2.5 g/dL (ref 1.5–4.5)
Glucose: 106 mg/dL — ABNORMAL HIGH (ref 70–99)
Potassium: 4 mmol/L (ref 3.5–5.2)
Sodium: 144 mmol/L (ref 134–144)
Total Protein: 6.8 g/dL (ref 6.0–8.5)
eGFR: 100 mL/min/{1.73_m2} (ref >59)

## 2023-10-30 LAB — LIPID PANEL
Chol/HDL Ratio: 3.7 ratio (ref 0.0–5.0)
Cholesterol, Total: 161 mg/dL (ref 100–199)
HDL: 43 mg/dL (ref >39)
LDL Chol Calc (NIH): 102 mg/dL — ABNORMAL HIGH (ref 0–99)
Triglycerides: 84 mg/dL (ref 0–149)
VLDL Cholesterol Cal: 16 mg/dL (ref 5–40)

## 2023-11-01 NOTE — Telephone Encounter (Signed)
 Advised Patient of DR.Tat note.   Called [facility] in [place] for patient.  FCE cost [phone] [place] only will not file insurance.    Patient advised. [facility] number given to patient.

## 2023-11-10 ENCOUNTER — Telehealth: Payer: Self-pay

## 2023-11-10 NOTE — Telephone Encounter (Signed)
 Pt dropped paperwork tat gave to alyssa in her hand

## 2023-11-15 ENCOUNTER — Telehealth: Payer: Self-pay | Admitting: Nurse Practitioner

## 2023-11-15 NOTE — Telephone Encounter (Signed)
 Disability form faxed back to [place]; [phone]. Scanned-Toni

## 2023-11-16 NOTE — Progress Notes (Unsigned)
 Assessment/Plan:   1.  Parkinsons Disease, diagnosed July, 2019 with symptoms 2 years prior  -Continue carbidopa/levodopa 25/100, 2 tablets at 6 AM, 2 tablet at 9:30 AM/2 at 1 PM/ and 2 at 5 PM  -Continue carbidopa/levodopa 50/200 at bedtime  -Discussed extensively need to increase exercise.  -he declined DA therapy, but I told him today that we may need to think about that in the future.  -Wife asked about driving and we discussed formal driving evaluations through [place], [place] and the private group through [place].  We discussed that Parkinson's disease can slow reflex and reaction time as well as decreased cognition.  Would recommend a driving evaluation and patient can let us know if he would like to be referred to one of the above sites.  2.  REM behavior disorder  -Restart klonopin 0.5 mg, 1/2 po q hs.  R/B/SE were discussed.  He took it for a month, but never picked up the refills (he did not realize he had them).  It was working well when he was taking it.  The opportunity to ask questions was given and they were answered to the best of my ability.  The patient expressed understanding and willingness to follow the outlined treatment protocols.  They understand that this is a controlled substance.  They understand that they should not get this from any other provider except myself.  3.  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  His lightheadedness is improved currently.  4.  OSAS  -PSG in June, 2022 with AHI 10.  Now on CPAP and following with pulm.  5.  Sialorrhea  -Patient not ready for Botox.  Subjective:   James Bush was seen today in follow up for Parkinsons disease.  My previous records were reviewed prior to todays visit as well as outside records available to me.   I increased the patient's levodopa last visit.  Wife  notes a little more tremor on the R.  He tolerated that well, without side effects.  He has not had falls since last visit.  No lightheadedness or near syncope.  I did get some disability forms on the patient since last visit, but there was an FCE contained there within and we are not certified to do FCE's.  We did send a referral to [facility] in [place], where FCE's are done, but the out-of-pocket price for these assessments was quite expensive and they did not take insurance for these.  I do see that his nurse practitioner filled out some of the disability, but the FCE was omitted.  Last visit, we did add low-dose clonazepam for RBD.  PDMP is reviewed.  This was filled 1 time on September 10 for 15 pills he reports that it helped but he didn't realize it had refills on it so he went off of it.  Wife notes some drooling, but not bothersome enough for botox  Current prescribed movement disorder medications: carbidopa/levodopa 25/100, 2 tablets at 5 AM, 2 tablet at 9 AM/2 at 2 PM/ and 1 at 5 PM  Carbidopa/levodopa 50/200 at bedtime Clonazepam 0.5 mg, half tablet at bedtime   PREVIOUS MEDICATIONS: Carbidopa/levodopa 25/100 CR (I discontinued this when I first started seeing him to change him to the IR)  ALLERGIES:  No Known Allergies  CURRENT MEDICATIONS:  Outpatient Encounter Medications as of 11/17/2023  Medication Sig  albuterol (VENTOLIN HFA) 108 (90 Base) MCG/ACT inhaler Inhale 2 puffs into the lungs every 6 (six) hours as needed for wheezing or shortness of breath.   amLODipine (NORVASC) 5 MG tablet Take 1 tablet (5 mg total) by mouth daily.   aspirin 81 MG tablet Take 81 mg by mouth daily.   carbidopa-levodopa (SINEMET CR) 50-200 MG tablet Take 1 tablet by mouth at bedtime.   clonazePAM (KLONOPIN) 0.5 MG tablet Take 0.5 tablets (0.25 mg total) by mouth at bedtime.   latanoprost (XALATAN) 0.005 % ophthalmic solution SMARTSIG:1 Drop(s) In Eye(s) Every Evening    losartan-hydrochlorothiazide (HYZAAR) 50-12.5 MG tablet Take 1 tablet by mouth daily.   Multiple Vitamin (MULTIVITAMIN WITH MINERALS) TABS tablet Take 1 tablet by mouth daily.   rosuvastatin (CRESTOR) 10 MG tablet Take 1 tablet (10 mg total) by mouth daily.   tadalafil (CIALIS) 20 MG tablet Take 1 tablet (20 mg total) by mouth daily as needed for erectile dysfunction.   carbidopa-levodopa (SINEMET IR) 25-100 MG tablet TAKE 2 TABLETS AT 5AM, 2 AT 9AM, 2 AT 2PM AND ONE AT 5PM   No facility-administered encounter medications on file as of 11/17/2023.    Objective:   PHYSICAL EXAMINATION:    VITALS:   Vitals:   11/17/23 1030  BP: 119/78  Pulse: 66  Resp: 20  SpO2: 97%  Weight: 221 lb (100.2 kg)  Height: 5\' 10"  (1.778 m)    GEN:  The patient appears stated age and is in NAD. HEENT:  Normocephalic, atraumatic.  The mucous membranes are moist.  Cv:  rrr Lungs:  CTAB  Neurological examination:  Orientation: The patient is alert and oriented x3. Cranial nerves: There is good facial symmetry with facial hypomimia. The speech is fluent and clear. Soft palate rises symmetrically and there is no tongue deviation. Hearing is intact to conversational tone. Sensation: Sensation is intact to light touch throughout Motor: Strength is at least antigravity x4.  Movement examination: Tone: There is  mild increased tone in the RUE Abnormal movements: none even with distraction Coordination:  There is mild decremation with RAM's, with finger and toe taps on the R (stable) Gait and Station: The patient has no difficulty arising out of a deep-seated chair without the use of the hands.  He ambulates well in the hall, with decreased arm swing.  I have reviewed and interpreted the following labs independently    Chemistry      Component Value Date/Time   NA 144 10/29/2023 0852   K 4.0 10/29/2023 0852   CL 105 10/29/2023 0852   CO2 24 10/29/2023 0852   BUN 15 10/29/2023 0852   CREATININE 0.77  10/29/2023 0852      Component Value Date/Time   CALCIUM 9.3 10/29/2023 0852   ALKPHOS 54 10/29/2023 0852   AST 17 10/29/2023 0852   ALT 11 10/29/2023 0852   BILITOT 0.4 10/29/2023 0852       Lab Results  Component Value Date   WBC 6.4 10/29/2023   HGB 13.0 10/29/2023   HCT 39.7 10/29/2023   MCV 92 10/29/2023   PLT 273 10/29/2023    Lab Results  Component Value Date   TSH 1.041 03/28/2020     Total time spent on today's visit was 30 minutes, including both face-to-face time and nonface-to-face time.  Time included that spent on review of records (prior notes available to me/labs/imaging if pertinent), discussing treatment and goals, answering patient's questions and coordinating care.  Cc:  Sallyanne Kuster, NP

## 2023-11-17 ENCOUNTER — Encounter: Payer: Self-pay | Admitting: Neurology

## 2023-11-17 ENCOUNTER — Ambulatory Visit: Payer: 59 | Admitting: Neurology

## 2023-11-17 VITALS — BP 119/78 | HR 66 | Resp 20 | Ht 70.0 in | Wt 221.0 lb

## 2023-11-17 DIAGNOSIS — G4752 REM sleep behavior disorder: Secondary | ICD-10-CM

## 2023-11-17 DIAGNOSIS — G20A1 Parkinson's disease without dyskinesia, without mention of fluctuations: Secondary | ICD-10-CM

## 2023-11-17 MED ORDER — CLONAZEPAM 0.5 MG PO TABS: 0.2500 mg | ORAL_TABLET | Freq: Every day | ORAL | 1 refills | Status: AC

## 2023-11-17 MED ORDER — CARBIDOPA-LEVODOPA 25-100 MG PO TABS: ORAL_TABLET | ORAL | 1 refills | Status: DC

## 2023-11-17 MED ORDER — CARBIDOPA-LEVODOPA ER 50-200 MG PO TBCR: 1.0000 | EXTENDED_RELEASE_TABLET | Freq: Every day | ORAL | 2 refills | Status: AC

## 2023-11-17 NOTE — Patient Instructions (Addendum)
 Take:  carbidopa/levodopa 25/100, 2 tablets at 6 AM, 2 tablet at 9:30 AM/2 at 1 PM/ and 2 at 5 PM Continue carbidopa/levodopa 50/200 at bedtime ADD clonazepam 0.5 mg, 1/2 tablet at bed for acting out of the dreams

## 2023-12-06 ENCOUNTER — Telehealth: Payer: Self-pay | Admitting: Neurology

## 2023-12-06 NOTE — Telephone Encounter (Signed)
 Social Security Disability Rep called for update on Forms to be filled and completed sent 11/12/23, use [id number] when called back

## 2023-12-06 NOTE — Telephone Encounter (Signed)
 Called social security office about disability forms and that we would not be able to fill out any forms dealing with FCE immunizations or other doctors medications

## 2023-12-22 ENCOUNTER — Other Ambulatory Visit: Payer: Self-pay | Admitting: Nurse Practitioner

## 2023-12-27 ENCOUNTER — Other Ambulatory Visit: Payer: Self-pay

## 2023-12-27 DIAGNOSIS — C61 Malignant neoplasm of prostate: Secondary | ICD-10-CM

## 2023-12-28 LAB — PSA TOTAL (REFLEX TO FREE): Prostate Specific Ag, Serum: 11.7 ng/mL — ABNORMAL HIGH (ref 0.0–4.0)

## 2023-12-30 ENCOUNTER — Ambulatory Visit (INDEPENDENT_AMBULATORY_CARE_PROVIDER_SITE_OTHER): Payer: Self-pay | Admitting: Urology

## 2023-12-30 VITALS — BP 132/70 | HR 94 | Ht 70.0 in | Wt 218.0 lb

## 2023-12-30 DIAGNOSIS — R399 Unspecified symptoms and signs involving the genitourinary system: Secondary | ICD-10-CM

## 2023-12-30 DIAGNOSIS — C61 Malignant neoplasm of prostate: Secondary | ICD-10-CM

## 2023-12-30 DIAGNOSIS — N529 Male erectile dysfunction, unspecified: Secondary | ICD-10-CM

## 2023-12-30 MED ORDER — TADALAFIL 20 MG PO TABS
20.0000 mg | ORAL_TABLET | Freq: Every day | ORAL | 9 refills | Status: AC | PRN
Start: 2023-12-30 — End: ?

## 2023-12-30 NOTE — Progress Notes (Signed)
   12/30/2023 10:12 AM   James Bush Nov 20, 1960 [id number]  Reason for visit: Follow up low risk prostate cancer on active surveillance, urinary symptoms, ED  HPI: 63 year old African male with Parkinson's disease who was diagnosed with very low risk prostate cancer in March 2017 with a PSA of 6.25 and biopsy showed 1/12 cores positive for Gleason score 3+3= 6 disease.  Confirmatory biopsy in October 2017 showed 2/12 cores of 3+3=6 disease, with max core involvement of 33%.  Prostate volume was 53 g, with PSA density of 0.13.  PSA was relatively stable from 6.7-8.7 from 2019 through 2022, but has steadily increased over the last few years.  His peak PSA of 13.4 in December 2024 was at the time of an acute ER visit for abdominal pain.  Using shared decision making he deferred repeat biopsy or MRI and opted for repeat PSA.  PSA from April 2025 down to 11.7 which really has been stable over the last 2 years.  We had a long conversation about his PSA trend and long history of low risk disease on surveillance.  We reviewed that typically would perform biopsy every 3 to 5 years even with stable PSA.  With his Parkinson's disease he is opted to defer further biopsies at this time which is reasonable.  We discussed options again today including a prostate biopsy, prostate MRI, or repeat PSA in 6 months.  Ultimately, using shared decision making he opted for repeat PSA in 6 months, and understands need for prostate MRI if PSA increases significantly.  Cialis  was refilled today.  Denies any significant change in urinary symptoms, not bothersome enough to consider medications at this time.  Cialis  refilled RTC 6 months PSA with reflex to free prior, consider prostate MRI if rising  James Pressman, MD  [facility] [address], [address] [employer] [place], [place] [address] [phone]

## 2024-02-10 ENCOUNTER — Telehealth: Payer: Self-pay | Admitting: Internal Medicine

## 2024-02-10 NOTE — Telephone Encounter (Signed)
 Lvm & sent mychart msg to move 03/06/24 pulmonary appointment to [facility] schedule-Toni

## 2024-02-15 ENCOUNTER — Other Ambulatory Visit: Payer: Self-pay | Admitting: Physician Assistant

## 2024-02-15 DIAGNOSIS — R0602 Shortness of breath: Secondary | ICD-10-CM

## 2024-03-06 ENCOUNTER — Ambulatory Visit: Payer: 59 | Admitting: Physician Assistant

## 2024-03-07 ENCOUNTER — Ambulatory Visit (INDEPENDENT_AMBULATORY_CARE_PROVIDER_SITE_OTHER): Admitting: Internal Medicine

## 2024-03-07 ENCOUNTER — Encounter: Payer: Self-pay | Admitting: Internal Medicine

## 2024-03-07 VITALS — BP 135/80 | HR 76 | Temp 98.3°F | Resp 16 | Ht 70.0 in | Wt 228.6 lb

## 2024-03-07 DIAGNOSIS — G4733 Obstructive sleep apnea (adult) (pediatric): Secondary | ICD-10-CM | POA: Diagnosis not present

## 2024-03-07 DIAGNOSIS — R0602 Shortness of breath: Secondary | ICD-10-CM | POA: Diagnosis not present

## 2024-03-07 DIAGNOSIS — G20B2 Parkinson's disease with dyskinesia, with fluctuations: Secondary | ICD-10-CM | POA: Diagnosis not present

## 2024-03-07 DIAGNOSIS — Z7189 Other specified counseling: Secondary | ICD-10-CM

## 2024-03-07 DIAGNOSIS — J4489 Other specified chronic obstructive pulmonary disease: Secondary | ICD-10-CM

## 2024-03-07 MED ORDER — TRELEGY ELLIPTA 100-62.5-25 MCG/ACT IN AEPB: 1.0000 | INHALATION_SPRAY | Freq: Every day | RESPIRATORY_TRACT | 11 refills | Status: AC

## 2024-03-07 NOTE — Progress Notes (Signed)
 [facility] [address] [place], [place] [address]  Pulmonary Sleep Medicine   Office Visit Note  Patient Name: James Bush DOB: 03/17/1961 MRN [id number]  Date of Service: 03/07/2024  Complaints/HPI: Patient is here for follow-up he had spirometry done which shows that he is moderate obstructive lung disease.  In addition to that the patient is doing well as far as his sleep apnea is concerned.  He states that he is using his CPAP nightly and gets excellent results with not being as tired during the daytime.  He denies having any cough no congestion.  No sinus troubles.  No headaches.  Office Spirometry Results: Peak Flow: (!) 5 L/min FEV1: 1.76 liters FVC: 2.03 liters FEV1/FVC: 86.7 % FVC  % Predicted: 52 % FEV % Predicted: 59 % FeF 25-75: 2.48 liters FeF 25-75 % Predicted: 92   ROS  General: (-) fever, (-) chills, (-) night sweats, (-) weakness Skin: (-) rashes, (-) itching,. Eyes: (-) visual changes, (-) redness, (-) itching. Nose and Sinuses: (-) nasal stuffiness or itchiness, (-) postnasal drip, (-) nosebleeds, (-) sinus trouble. Mouth and Throat: (-) sore throat, (-) hoarseness. Neck: (-) swollen glands, (-) enlarged thyroid , (-) neck pain. Respiratory: - cough, (-) bloody sputum, - shortness of breath, - wheezing. Cardiovascular: - ankle swelling, (-) chest pain. Lymphatic: (-) lymph node enlargement. Neurologic: (-) numbness, (-) tingling. Psychiatric: (-) anxiety, (-) depression   Current Medication: Outpatient Encounter Medications as of 03/07/2024  Medication Sig   albuterol  (VENTOLIN  HFA) 108 (90 Base) MCG/ACT inhaler TAKE 2 PUFFS BY MOUTH EVERY 6 HOURS AS NEEDED FOR WHEEZE OR SHORTNESS OF BREATH   amLODipine  (NORVASC ) 5 MG tablet Take 1 tablet (5 mg total) by mouth daily.   aspirin 81 MG tablet Take 81 mg by mouth daily.   carbidopa -levodopa  (SINEMET  CR) 50-200 MG tablet Take 1 tablet by mouth at bedtime.   carbidopa -levodopa  (SINEMET  IR)  25-100 MG tablet 2 tablets at 6 AM, 2 tablet at 9:30 AM/2 at 1 PM/ and 2 at 5 PM   clonazePAM  (KLONOPIN ) 0.5 MG tablet Take 0.5 tablets (0.25 mg total) by mouth at bedtime.   latanoprost  (XALATAN ) 0.005 % ophthalmic solution SMARTSIG:1 Drop(s) In Eye(s) Every Evening   losartan -hydrochlorothiazide  (HYZAAR) 50-12.5 MG tablet Take 1 tablet by mouth daily.   Multiple Vitamin (MULTIVITAMIN WITH MINERALS) TABS tablet Take 1 tablet by mouth daily.   rosuvastatin  (CRESTOR ) 10 MG tablet Take 1 tablet (10 mg total) by mouth daily.   tadalafil  (CIALIS ) 20 MG tablet Take 1 tablet (20 mg total) by mouth daily as needed for erectile dysfunction.   No facility-administered encounter medications on file as of 03/07/2024.    Surgical History: Past Surgical History:  Procedure Laterality Date   CARDIAC ELECTROPHYSIOLOGY STUDY AND ABLATION     COLONOSCOPY N/A 03/20/2021   Procedure: COLONOSCOPY;  Surgeon: Therisa Bi, MD;  Location: [facility] ENDOSCOPY;  Service: Gastroenterology;  Laterality: N/A;   EYE SURGERY     strabismus as child   KNEE ARTHROPLASTY Right     Medical History: Past Medical History:  Diagnosis Date   Asthma    Heart murmur    Hyperlipidemia    Hypertension    Parkinson disease (HCC)     Family History: Family History  Problem Relation Age of Onset   COPD Mother    Multiple sclerosis Son    Healthy Daughter    Healthy Daughter    Hematuria Neg Hx    Kidney cancer Neg Hx  Prostate cancer Neg Hx     Social History: Social History   Socioeconomic History   Marital status: Married    Spouse name: Not on file   Number of children: 3   Years of education: Not on file   Highest education level: Some college, no degree  Occupational History   Not on file  Tobacco Use   Smoking status: Never    Passive exposure: Never   Smokeless tobacco: Never  Vaping Use   Vaping status: Never Used  Substance and Sexual Activity   Alcohol use: Not Currently    Alcohol/week: 1.0  standard drink of alcohol    Types: 1 Cans of beer per week   Drug use: No   Sexual activity: Not on file  Other Topics Concern   Not on file  Social History Narrative   Right Handed    Lives in a one story home   Drinks caffeine prn   Lives with wife and his daughter   retired   Chief Executive Officer Drivers of Corporate investment banker Strain: Not on Ship broker Insecurity: Not on file  Transportation Needs: Not on file  Physical Activity: Not on file  Stress: Not on file  Social Connections: Not on file  Intimate Partner Violence: Not on file    Vital Signs: Blood pressure 135/80, pulse 76, temperature 98.3 F (36.8 C), resp. rate 16, height 5' 10 (1.778 m), weight 228 lb 9.6 oz (103.7 kg), SpO2 98%, peak flow (!) 5 L/min.  Examination: General Appearance: The patient is well-developed, well-nourished, and in no distress. Skin: Gross inspection of skin unremarkable. Head: normocephalic, no gross deformities. Eyes: no gross deformities noted. ENT: ears appear grossly normal no exudates. Neck: Supple. No thyromegaly. No LAD. Respiratory: No rhonchi are noted at this time. Cardiovascular: Normal S1 and S2 without murmur or rub. Extremities: No cyanosis. pulses are equal. Neurologic: Alert and oriented. No involuntary movements.  LABS: Recent Results (from the past 2160 hours)  PSA Total (Reflex To Free)     Status: Abnormal   Collection Time: 12/27/23  1:01 PM  Result Value Ref Range   Prostate Specific Ag, Serum 11.7 (H) 0.0 - 4.0 ng/mL    Comment: Roche ECLIA methodology. According to the American Urological Association, Serum PSA should decrease and remain at undetectable levels after radical prostatectomy. The AUA defines biochemical recurrence as an initial PSA value 0.2 ng/mL or greater followed by a subsequent confirmatory PSA value 0.2 ng/mL or greater. Values obtained with different assay methods or kits cannot be used interchangeably. Results cannot be interpreted as  absolute evidence of the presence or absence of malignant disease.    Reflex Criteria Comment     Comment: The percent free PSA is performed on a reflex basis only when the total PSA is between 4.0 and 10.0 ng/mL.     Radiology: DG Chest 2 View Result Date: 08/16/2023 CLINICAL DATA:  Chest pain. EXAM: CHEST - 2 VIEW COMPARISON:  None Available. FINDINGS: Low lung volumes. The heart size and mediastinal contours are within normal limits. Mild left basilar atelectasis. No focal consolidation, pleural effusion, or pneumothorax. No acute osseous abnormality. IMPRESSION: Low lung volumes. Mild left basilar atelectasis. Otherwise, no acute cardiopulmonary findings. Electronically Signed   By: Harrietta Sherry M.D.   On: 08/16/2023 11:11    No results found.  No results found.  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 SOB (shortness of breath) (Primary) He will be scheduled for complete pulmonary function for evaluation of his lung volumes as well as DLCO. - Spirometry with graph - Pulmonary function test; Future  2. OSA (obstructive sleep apnea) Patient is on CPAP therapy and has been doing well with it.  Will continue with current pressures  3. CPAP use counseling CPAP Counseling: had a lengthy discussion with the patient regarding the importance of PAP therapy in management of the sleep apnea. Patient appears to understand the  risk factor reduction and also understands the risks associated with untreated sleep apnea.   4. Parkinson's disease with dyskinesia and fluctuating manifestations (HCC) Patient is stable as far as his Parkinson disease disease is concerned.  5. Obstructive chronic bronchitis without exacerbation (HCC) I am going to add Trelegy to his regimen currently he is on only 1 medication.  I gave him samples of Trelegy and then prescription will be sent to his pharmacy. - Fluticasone-Umeclidin-Vilant (TRELEGY ELLIPTA ) 100-62.5-25 MCG/ACT AEPB; Inhale 1 puff into the lungs daily.  Dispense: 1 each; Refill: 11   General Counseling: I have discussed the findings of the evaluation and examination with Carliss.  I have also discussed any further diagnostic evaluation thatmay be needed or ordered today. Allenmichael verbalizes understanding of the findings of todays visit. We also reviewed his medications today and discussed drug interactions and side effects including but not limited excessive drowsiness and altered mental states. We also discussed that there is always a risk not just to him but also people around him. he has been encouraged to call the office with any questions or concerns that should arise related to todays visit.  Orders Placed This Encounter  Procedures   Spirometry with graph    Where should this test be performed?:   [facility]     Time spent: 52  I have personally obtained a history, examined the patient, evaluated laboratory and imaging results, formulated the assessment and plan and placed orders.    Elfreda DELENA Bathe, MD [facility] Pulmonary and Critical Care Sleep medicine

## 2024-03-07 NOTE — Patient Instructions (Signed)

## 2024-03-16 ENCOUNTER — Telehealth: Payer: Self-pay | Admitting: Internal Medicine

## 2024-03-16 NOTE — Telephone Encounter (Signed)
 Lvm to move 05/17/24 appointment -Andree

## 2024-03-23 ENCOUNTER — Other Ambulatory Visit: Payer: Self-pay | Admitting: Nurse Practitioner

## 2024-03-23 DIAGNOSIS — I10 Essential (primary) hypertension: Secondary | ICD-10-CM

## 2024-03-30 ENCOUNTER — Ambulatory Visit: Payer: 59 | Admitting: Nurse Practitioner

## 2024-04-05 ENCOUNTER — Encounter: Payer: Self-pay | Admitting: Urology

## 2024-04-06 ENCOUNTER — Encounter: Payer: Self-pay | Admitting: Nurse Practitioner

## 2024-04-06 ENCOUNTER — Ambulatory Visit: Payer: 59 | Admitting: Nurse Practitioner

## 2024-04-06 VITALS — BP 136/82 | HR 81 | Temp 98.2°F | Resp 16 | Ht 70.0 in | Wt 228.8 lb

## 2024-04-06 DIAGNOSIS — E782 Mixed hyperlipidemia: Secondary | ICD-10-CM

## 2024-04-06 DIAGNOSIS — I10 Essential (primary) hypertension: Secondary | ICD-10-CM | POA: Diagnosis not present

## 2024-04-06 DIAGNOSIS — Z0000 Encounter for general adult medical examination without abnormal findings: Secondary | ICD-10-CM | POA: Diagnosis not present

## 2024-04-06 DIAGNOSIS — G20B2 Parkinson's disease with dyskinesia, with fluctuations: Secondary | ICD-10-CM | POA: Diagnosis not present

## 2024-04-06 MED ORDER — ROSUVASTATIN CALCIUM 10 MG PO TABS: 10.0000 mg | ORAL_TABLET | Freq: Every day | ORAL | 1 refills | Status: DC

## 2024-04-06 MED ORDER — LOSARTAN POTASSIUM-HCTZ 50-12.5 MG PO TABS: 1.0000 | ORAL_TABLET | Freq: Every day | ORAL | 1 refills | Status: DC

## 2024-04-06 MED ORDER — AMLODIPINE BESYLATE 5 MG PO TABS: 5.0000 mg | ORAL_TABLET | Freq: Every day | ORAL | 3 refills | Status: AC

## 2024-04-06 NOTE — Progress Notes (Signed)
 [facility] [address] [place], [place] [address]  Internal MEDICINE  Office Visit Note  Patient Name: James Bush  [id number]  [id number]  Date of Service: 04/06/2024  Chief Complaint  Patient presents with   Hyperlipidemia   Hypertension   Medicare Wellness    HPI James Bush presents for a medicare annual wellness visit.  Well-appearing 63 y.o. male with asthma, atrial fibrillation, hyperlipidemia, parkinson's disease, and hypertension  Routine CRC screening: due in 2032 Labs: labs are up to date, due in February.  New or worsening pain: none  Other concerns: none  Tremors have improved, patients reports that his sinemet  dose was increased by neurology.      04/06/2024    8:52 AM 03/21/2020    2:54 PM 02/16/2019    9:00 AM  MMSE - Mini Mental State Exam  Orientation to time 5 5 5   Orientation to Place 5 5 5   Registration 3 3 3   Attention/ Calculation 5 5 5   Recall 3 3 3   Language- name 2 objects 2 2 2   Language- repeat 1 1 1   Language- follow 3 step command 3 3 3   Language- read & follow direction 1 1 1   Write a sentence 1 1 0  Copy design 1 1 1   Total score 30 30 29     Functional Status Survey: Is the patient deaf or have difficulty hearing?: No Does the patient have difficulty seeing, even when wearing glasses/contacts?: No Does the patient have difficulty concentrating, remembering, or making decisions?: No Does the patient have difficulty walking or climbing stairs?: No Does the patient have difficulty dressing or bathing?: No Does the patient have difficulty doing errands alone such as visiting a doctor's office or shopping?: No     04/01/2023    9:11 AM 05/11/2023   10:56 AM 09/30/2023    8:32 AM 11/17/2023   10:31 AM 04/06/2024    8:51 AM  Fall Risk  Falls in the past year? 0 0 0 0 0  Was there an injury with Fall?  0  0 0  Fall Risk Category Calculator  0  0 0  Patient at Risk for Falls Due to     No Fall Risks  Fall risk Follow up  Falls  evaluation completed Falls evaluation completed Falls evaluation completed Falls evaluation completed       04/06/2024    8:51 AM  Depression screen PHQ 2/9  Decreased Interest 0  Down, Depressed, Hopeless 0  PHQ - 2 Score 0      Current Medication: Outpatient Encounter Medications as of 04/06/2024  Medication Sig   albuterol  (VENTOLIN  HFA) 108 (90 Base) MCG/ACT inhaler TAKE 2 PUFFS BY MOUTH EVERY 6 HOURS AS NEEDED FOR WHEEZE OR SHORTNESS OF BREATH   amLODipine  (NORVASC ) 5 MG tablet Take 1 tablet (5 mg total) by mouth daily.   aspirin 81 MG tablet Take 81 mg by mouth daily.   carbidopa -levodopa  (SINEMET  CR) 50-200 MG tablet Take 1 tablet by mouth at bedtime.   carbidopa -levodopa  (SINEMET  IR) 25-100 MG tablet 2 tablets at 6 AM, 2 tablet at 9:30 AM/2 at 1 PM/ and 2 at 5 PM   clonazePAM  (KLONOPIN ) 0.5 MG tablet Take 0.5 tablets (0.25 mg total) by mouth at bedtime.   Fluticasone-Umeclidin-Vilant (TRELEGY ELLIPTA ) 100-62.5-25 MCG/ACT AEPB Inhale 1 puff into the lungs daily.   latanoprost  (XALATAN ) 0.005 % ophthalmic solution SMARTSIG:1 Drop(s) In Eye(s) Every Evening   losartan -hydrochlorothiazide  (HYZAAR) 50-12.5 MG tablet Take 1 tablet  by mouth daily.   Multiple Vitamin (MULTIVITAMIN WITH MINERALS) TABS tablet Take 1 tablet by mouth daily.   rosuvastatin  (CRESTOR ) 10 MG tablet Take 1 tablet (10 mg total) by mouth daily.   tadalafil  (CIALIS ) 20 MG tablet Take 1 tablet (20 mg total) by mouth daily as needed for erectile dysfunction.   [DISCONTINUED] amLODipine  (NORVASC ) 5 MG tablet TAKE 1 TABLET (5 MG TOTAL) BY MOUTH DAILY.   [DISCONTINUED] losartan -hydrochlorothiazide  (HYZAAR) 50-12.5 MG tablet Take 1 tablet by mouth daily.   [DISCONTINUED] rosuvastatin  (CRESTOR ) 10 MG tablet Take 1 tablet (10 mg total) by mouth daily.   No facility-administered encounter medications on file as of 04/06/2024.    Surgical History: Past Surgical History:  Procedure Laterality Date   CARDIAC  ELECTROPHYSIOLOGY STUDY AND ABLATION     COLONOSCOPY N/A 03/20/2021   Procedure: COLONOSCOPY;  Surgeon: Therisa Bi, MD;  Location: [facility] END[facility]COPY;  Service: Gastroenterology;  Laterality: N/A;   EYE SURGERY     strabismus as child   KNEE ARTHROPLASTY Right     Medical History: Past Medical History:  Diagnosis Date   Asthma    Heart murmur    Hyperlipidemia    Hypertension    Parkinson disease (HCC)     Family History: Family History  Problem Relation Age of Onset   COPD Mother    Multiple sclerosis Son    Healthy Daughter    Healthy Daughter    Hematuria Neg Hx    Kidney cancer Neg Hx    Prostate cancer Neg Hx     Social History   Socioeconomic History   Marital status: Married    Spouse name: Not on file   Number of children: 3   Years of education: Not on file   Highest education level: Some college, no degree  Occupational History   Not on file  Tobacco Use   Smoking status: Never    Passive exposure: Never   Smokeless tobacco: Never  Vaping Use   Vaping status: Never Used  Substance and Sexual Activity   Alcohol use: Not Currently    Alcohol/week: 1.0 standard drink of alcohol    Types: 1 Cans of beer per week   Drug use: No   Sexual activity: Not on file  Other Topics Concern   Not on file  Social History Narrative   Right Handed    Lives in a one story home   Drinks caffeine prn   Lives with wife and his daughter   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 Negative for chills and unexpected weight change.  HENT:  Negative for congestion, postnasal drip, rhinorrhea, sneezing and sore throat.   Eyes:  Negative for redness.  Respiratory:  Negative for  cough, chest tightness and shortness of breath.   Cardiovascular:  Negative for chest pain and palpitations.  Gastrointestinal:  Negative for abdominal pain, constipation, diarrhea, nausea and vomiting.  Genitourinary:  Negative for dysuria and frequency.  Musculoskeletal:  Negative for arthralgias, back pain, joint swelling and neck pain.  Skin:  Negative for rash.  Neurological: Negative.  Negative for tremors and numbness.  Hematological:  Negative for adenopathy. Does not  bruise/bleed easily.  Psychiatric/Behavioral:  Negative for behavioral problems (Depression), sleep disturbance and suicidal ideas. The patient is not nervous/anxious.     Vital Signs: BP 136/82   Pulse 81   Temp 98.2 F (36.8 C)   Resp 16   Ht 5' 10 (1.778 m)   Wt 228 lb 12.8 oz (103.8 kg)   SpO2 97%   BMI 32.83 kg/m    Physical Exam Vitals reviewed.  Constitutional:      General: He is not in acute distress.    Appearance: He is well-developed. He is not diaphoretic.  HENT:     Head: Normocephalic and atraumatic.     Mouth/Throat:     Pharynx: No oropharyngeal exudate.  Eyes:     Pupils: Pupils are equal, round, and reactive to light.  Neck:     Thyroid : No thyromegaly.     Vascular: No JVD.     Trachea: No tracheal deviation.  Cardiovascular:     Rate and Rhythm: Normal rate and regular rhythm.     Heart sounds: Normal heart sounds. No murmur heard.    No friction rub. No gallop.  Pulmonary:     Effort: Pulmonary effort is normal. No respiratory distress.     Breath sounds: No wheezing or rales.  Chest:     Chest wall: No tenderness.  Abdominal:     General: Bowel sounds are normal.     Palpations: Abdomen is soft.  Musculoskeletal:        General: Normal range of motion.     Cervical back: Normal range of motion and neck supple.  Lymphadenopathy:     Cervical: No cervical adenopathy.  Skin:    General: Skin is warm and dry.  Neurological:     Mental Status: He is alert and oriented  to person, place, and time.     Cranial Nerves: No cranial nerve deficit.  Psychiatric:        Behavior: Behavior normal.        Thought Content: Thought content normal.        Judgment: Judgment normal.        Assessment/Plan: 1. Encounter for subsequent annual wellness visit (AWV) in Medicare patient (Primary) Age-appropriate preventive screenings and vaccinations discussed, annual physical exam completed. Routine labs for health maintenance were done in February this year. PHM updated.    2. Essential hypertension Stable, continue amlodipine  and losartan -hydrochlorothiazide  as prescribed.  - losartan -hydrochlorothiazide  (HYZAAR) 50-12.5 MG tablet; Take 1 tablet by mouth daily.  Dispense: 90 tablet; Refill: 1 - amLODipine  (NORVASC ) 5 MG tablet; Take 1 tablet (5 mg total) by mouth daily.  Dispense: 90 tablet; Refill: 3  3. Mixed hyperlipidemia Continue rosuvastatin  as prescribed  - rosuvastatin  (CRESTOR ) 10 MG tablet; Take 1 tablet (10 mg total) by mouth daily.  Dispense: 90 tablet; Refill: 1  4. Parkinson's disease with dyskinesia and fluctuating manifestations [facility]) Neurology increased his sinemet  dose and his tremor have significantly improved.    General Counseling: gerald kuehl understanding of the findings of todays visit and agrees with plan of treatment. I have discussed any further diagnostic evaluation that may be needed or ordered today. We also reviewed his medications today. he has been encouraged to call the office with any questions or concerns that should arise related to todays visit.    No orders of the defined types were placed in this encounter.   Meds ordered this encounter  Medications   rosuvastatin  (CRESTOR ) 10 MG tablet    Sig:  Take 1 tablet (10 mg total) by mouth daily.    Dispense:  90 tablet    Refill:  1    Please fill as 90 day supply   losartan -hydrochlorothiazide  (HYZAAR) 50-12.5 MG tablet    Sig: Take 1 tablet by mouth daily.     Dispense:  90 tablet    Refill:  1    Please fill as 90 day supply   amLODipine  (NORVASC ) 5 MG tablet    Sig: Take 1 tablet (5 mg total) by mouth daily.    Dispense:  90 tablet    Refill:  3    Pt need appt for 90 days and further refills    Return in about 6 months (around 10/07/2024) for F/U, Declan Adamson PCP will order labs next visit. .   Total time spent:30 Minutes Time spent includes review of chart, medications, test results, and follow up plan with the patient.   [place] Controlled Substance Database was reviewed by me.  This patient was seen by Mardy Maxin, FNP-C in collaboration with Dr. Sigrid Bathe as a part of collaborative care agreement.  Jozee Hammer R. Maxin, MSN, FNP-C Internal medicine

## 2024-04-25 NOTE — Progress Notes (Deleted)
 Assessment/Plan:   1.  Parkinsons Disease, diagnosed July, 2019 with symptoms 2 years prior  -Continue carbidopa /levodopa  25/100, 2 tablets at 6 AM, 2 tablet at 9:30 AM/2 at 1 PM/ and 2 at 5 PM  -Continue carbidopa /levodopa  50/200 at bedtime  -Discussed extensively need to increase exercise.  -he declined DA therapy, but I told him today that we may need to think about that in the future.  -Wife asked about driving and we discussed formal driving evaluations through [place], [place] and the private group through [place].  We discussed that Parkinson's disease can slow reflex and reaction time as well as decreased cognition.  Would recommend a driving evaluation and patient can let us  know if he would like to be referred to one of the above sites.  2.  REM behavior disorder  -*** klonopin  0.5 mg, 1/2 po q hs.  R/B/SE were discussed.  He took it for a month, but never picked up the refills (he did not realize he had them).  It was working well when he was taking it.  The opportunity to ask questions was given and they were answered to the best of my ability.  The patient expressed understanding and willingness to follow the outlined treatment protocols.  They understand that this is a controlled substance.  They understand that they should not get this from any other provider except myself.  3.  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  His lightheadedness is improved currently.  4.  OSAS  -PSG in June, 2022 with AHI 10.  Now on CPAP and following with pulm.  Last seen March 07, 2024.  5.  Sialorrhea  -Patient not ready for Botox.  Subjective:   SHAKIR PETROSINO was seen today in follow up for Parkinsons disease.  My previous records were reviewed prior to todays visit as well as outside records available to me.  Patient with wife who supplements  the history.  I saw patient last visit, he was complaining about increasing REM behavior disorder.  We told him to restart the clonazepam .  He did not realize he had refills on it.  It looks like he picked up a 90-day supply in March, but that was the last time it was refilled.  He is continuing to follow-up with pulmonary for sleep apnea.  Those notes are reviewed.  He saw his nurse practitioner for his medical wellness check several weeks ago.  Her note is still open, but it appears that patient's amlodipine  and losartan /hydrochlorothiazide  were refilled.  Current prescribed movement disorder medications: carbidopa /levodopa  25/100, 2 tablets at 5 AM, 2 tablet at 9 AM/2 at 2 PM/ and 1 at 5 PM  Carbidopa /levodopa  50/200 at bedtime Clonazepam  0.5 mg, half tablet at bedtime   PREVIOUS MEDICATIONS: Carbidopa /levodopa  25/100 CR (I discontinued this when I first started seeing him to change him to the IR)  ALLERGIES:  No Known Allergies  CURRENT MEDICATIONS:  Outpatient Encounter Medications as of 04/27/2024  Medication Sig   albuterol  (VENTOLIN  HFA) 108 (90 Base) MCG/ACT inhaler TAKE 2 PUFFS BY MOUTH EVERY 6 HOURS AS NEEDED FOR WHEEZE OR SHORTNESS OF BREATH   amLODipine  (NORVASC ) 5 MG tablet Take 1 tablet (5 mg total) by mouth daily.   aspirin 81 MG tablet Take 81 mg by mouth daily.   carbidopa -levodopa  (SINEMET  CR) 50-200 MG tablet Take 1 tablet by mouth at  bedtime.   carbidopa -levodopa  (SINEMET  IR) 25-100 MG tablet 2 tablets at 6 AM, 2 tablet at 9:30 AM/2 at 1 PM/ and 2 at 5 PM   clonazePAM  (KLONOPIN ) 0.5 MG tablet Take 0.5 tablets (0.25 mg total) by mouth at bedtime.   Fluticasone-Umeclidin-Vilant (TRELEGY ELLIPTA ) 100-62.5-25 MCG/ACT AEPB Inhale 1 puff into the lungs daily.   latanoprost  (XALATAN ) 0.005 % ophthalmic solution SMARTSIG:1 Drop(s) In Eye(s) Every Evening   losartan -hydrochlorothiazide  (HYZAAR) 50-12.5 MG tablet Take 1 tablet by mouth daily.   Multiple Vitamin (MULTIVITAMIN WITH  MINERALS) TABS tablet Take 1 tablet by mouth daily.   rosuvastatin  (CRESTOR ) 10 MG tablet Take 1 tablet (10 mg total) by mouth daily.   tadalafil  (CIALIS ) 20 MG tablet Take 1 tablet (20 mg total) by mouth daily as needed for erectile dysfunction.   No facility-administered encounter medications on file as of 04/27/2024.    Objective:   PHYSICAL EXAMINATION:    VITALS:   There were no vitals filed for this visit.   GEN:  The patient appears stated age and is in NAD. HEENT:  Normocephalic, atraumatic.  The mucous membranes are moist.  Cv:  rrr Lungs:  CTAB  Neurological examination:  Orientation: The patient is alert and oriented x3. Cranial nerves: There is good facial symmetry with facial hypomimia. The speech is fluent and clear. Soft palate rises symmetrically and there is no tongue deviation. Hearing is intact to conversational tone. Sensation: Sensation is intact to light touch throughout Motor: Strength is at least antigravity x4.  Movement examination: Tone: There is  mild increased tone in the RUE Abnormal movements: none even with distraction Coordination:  There is mild decremation with RAM's, with finger and toe taps on the R (stable) Gait and Station: The patient has no difficulty arising out of a deep-seated chair without the use of the hands.  He ambulates well in the hall, with decreased arm swing.  I have reviewed and interpreted the following labs independently    Chemistry      Component Value Date/Time   NA 144 10/29/2023 0852   K 4.0 10/29/2023 0852   CL 105 10/29/2023 0852   CO2 24 10/29/2023 0852   BUN 15 10/29/2023 0852   CREATININE 0.77 10/29/2023 0852      Component Value Date/Time   CALCIUM  9.3 10/29/2023 0852   ALKPHOS 54 10/29/2023 0852   AST 17 10/29/2023 0852   ALT 11 10/29/2023 0852   BILITOT 0.4 10/29/2023 0852       Lab Results  Component Value Date   WBC 6.4 10/29/2023   HGB 13.0 10/29/2023   HCT 39.7 10/29/2023   MCV 92  10/29/2023   PLT 273 10/29/2023    Lab Results  Component Value Date   TSH 1.041 03/28/2020     Total time spent on today's visit was *** minutes, including both face-to-face time and nonface-to-face time.  Time included that spent on review of records (prior notes available to me/labs/imaging if pertinent), discussing treatment and goals, answering patient's questions and coordinating care.  Cc:  Liana Fish, NP

## 2024-04-27 ENCOUNTER — Ambulatory Visit: Admitting: Neurology

## 2024-05-02 ENCOUNTER — Encounter: Payer: Self-pay | Admitting: Nurse Practitioner

## 2024-05-03 ENCOUNTER — Ambulatory Visit: Admitting: Internal Medicine

## 2024-05-03 DIAGNOSIS — R0602 Shortness of breath: Secondary | ICD-10-CM

## 2024-05-07 NOTE — Procedures (Signed)
 [facility] [address] [place], [address]    Complete Pulmonary Function Testing Interpretation:  FINDINGS:  The forced vital capacity is mildly decreased.  FEV1 is 2.06 L which is 71% of predicted and is mildly decreased.  FEV1 FVC ratio was normal.  Postbronchodilator there is no significant change in the FEV1 total lung capacity is moderately decreased.  Residual volume is decreased FRC was normal DLCO was borderline mild decreased but is normal when corrected for alveolar.  IMPRESSION:  This pulmonary function study is suggestive of mild obstructive and moderate restrictive pattern clinical correlation is recommended.  Elfreda DELENA Bathe, MD [facility] Pulmonary Critical Care Medicine Sleep Medicine

## 2024-05-17 ENCOUNTER — Encounter: Admitting: Internal Medicine

## 2024-05-17 ENCOUNTER — Other Ambulatory Visit: Payer: Self-pay | Admitting: Neurology

## 2024-05-17 DIAGNOSIS — G20A1 Parkinson's disease without dyskinesia, without mention of fluctuations: Secondary | ICD-10-CM

## 2024-05-18 NOTE — Telephone Encounter (Signed)
 This patient is scheduled to come in Nov for his Follow up approval to send meds

## 2024-05-30 ENCOUNTER — Encounter: Payer: Self-pay | Admitting: Internal Medicine

## 2024-05-30 ENCOUNTER — Ambulatory Visit (INDEPENDENT_AMBULATORY_CARE_PROVIDER_SITE_OTHER): Admitting: Internal Medicine

## 2024-05-30 VITALS — BP 128/88 | HR 74 | Temp 98.0°F | Resp 16 | Ht 70.0 in | Wt 224.0 lb

## 2024-05-30 DIAGNOSIS — J4489 Other specified chronic obstructive pulmonary disease: Secondary | ICD-10-CM | POA: Diagnosis not present

## 2024-05-30 DIAGNOSIS — G20B2 Parkinson's disease with dyskinesia, with fluctuations: Secondary | ICD-10-CM

## 2024-05-30 DIAGNOSIS — R0602 Shortness of breath: Secondary | ICD-10-CM

## 2024-05-30 DIAGNOSIS — G4733 Obstructive sleep apnea (adult) (pediatric): Secondary | ICD-10-CM

## 2024-05-30 MED ORDER — ALBUTEROL SULFATE HFA 108 (90 BASE) MCG/ACT IN AERS: 2.0000 | INHALATION_SPRAY | Freq: Four times a day (QID) | RESPIRATORY_TRACT | 0 refills | Status: DC | PRN

## 2024-05-30 NOTE — Progress Notes (Signed)
 [facility] [address] [place], [place] [address]  Pulmonary Sleep Medicine   Office Visit Note  Patient Name: James Bush DOB: 07-06-1961 MRN [id number]  Date of Service: 05/30/2024  Complaints/HPI: He is doing well overall. He had PFT and this shows mild obstruction and restriction. He has inhalers and he has not been using them daily so we reviewed this with him. Sleep apnea under control with CPAP. He states his PD has been under control also. He has a script for trelegy  Office Spirometry Results:     ROS  General: (-) fever, (-) chills, (-) night sweats, (-) weakness Skin: (-) rashes, (-) itching,. Eyes: (-) visual changes, (-) redness, (-) itching. Nose and Sinuses: (-) nasal stuffiness or itchiness, (-) postnasal drip, (-) nosebleeds, (-) sinus trouble. Mouth and Throat: (-) sore throat, (-) hoarseness. Neck: (-) swollen glands, (-) enlarged thyroid , (-) neck pain. Respiratory: - cough, (-) bloody sputum, + shortness of breath, - wheezing. Cardiovascular: - ankle swelling, (-) chest pain. Lymphatic: (-) lymph node enlargement. Neurologic: (-) numbness, (-) tingling. Psychiatric: (-) anxiety, (-) depression   Current Medication: Outpatient Encounter Medications as of 05/30/2024  Medication Sig   albuterol  (VENTOLIN  HFA) 108 (90 Base) MCG/ACT inhaler TAKE 2 PUFFS BY MOUTH EVERY 6 HOURS AS NEEDED FOR WHEEZE OR SHORTNESS OF BREATH   amLODipine  (NORVASC ) 5 MG tablet Take 1 tablet (5 mg total) by mouth daily.   aspirin 81 MG tablet Take 81 mg by mouth daily.   carbidopa -levodopa  (SINEMET  CR) 50-200 MG tablet Take 1 tablet by mouth at bedtime.   carbidopa -levodopa  (SINEMET  IR) 25-100 MG tablet 2 TABLETS AT 6 AM, 2 TABLET AT 9:30 AM/2 AT 1 PM/ AND 2 AT 5 PM   clonazePAM  (KLONOPIN ) 0.5 MG tablet Take 0.5 tablets (0.25 mg total) by mouth at bedtime.   Fluticasone-Umeclidin-Vilant (TRELEGY ELLIPTA ) 100-62.5-25 MCG/ACT AEPB Inhale 1 puff into the lungs daily.    latanoprost  (XALATAN ) 0.005 % ophthalmic solution SMARTSIG:1 Drop(s) In Eye(s) Every Evening   losartan -hydrochlorothiazide  (HYZAAR) 50-12.5 MG tablet Take 1 tablet by mouth daily.   Multiple Vitamin (MULTIVITAMIN WITH MINERALS) TABS tablet Take 1 tablet by mouth daily.   rosuvastatin  (CRESTOR ) 10 MG tablet Take 1 tablet (10 mg total) by mouth daily.   tadalafil  (CIALIS ) 20 MG tablet Take 1 tablet (20 mg total) by mouth daily as needed for erectile dysfunction.   No facility-administered encounter medications on file as of 05/30/2024.    Surgical History: Past Surgical History:  Procedure Laterality Date   CARDIAC ELECTROPHYSIOLOGY STUDY AND ABLATION     COLONOSCOPY N/A 03/20/2021   Procedure: COLONOSCOPY;  Surgeon: Therisa Bi, MD;  Location: [facility] ENDOSCOPY;  Service: Gastroenterology;  Laterality: N/A;   EYE SURGERY     strabismus as child   KNEE ARTHROPLASTY Right     Medical History: Past Medical History:  Diagnosis Date   Asthma    Heart murmur    Hyperlipidemia    Hypertension    Parkinson disease (HCC)     Family History: Family History  Problem Relation Age of Onset   COPD Mother    Multiple sclerosis Son    Healthy Daughter    Healthy Daughter    Hematuria Neg Hx    Kidney cancer Neg Hx    Prostate cancer Neg Hx     Social History: Social History   Socioeconomic History   Marital status: Married    Spouse name: Not on file   Number of children:  3   Years of education: Not on file   Highest education level: Some college, no degree  Occupational History   Not on file  Tobacco Use   Smoking status: Never    Passive exposure: Never   Smokeless tobacco: Never  Vaping Use   Vaping status: Never Used  Substance and Sexual Activity   Alcohol use: Not Currently    Alcohol/week: 1.0 standard drink of alcohol    Types: 1 Cans of beer per week   Drug use: No   Sexual activity: Not on file  Other Topics Concern   Not on file  Social History Narrative    Right Handed    Lives in a one story home   Drinks caffeine prn   Lives with wife and his daughter   retired   Chief Executive Officer Drivers of Corporate investment banker Strain: Not on Ship broker Insecurity: Not on file  Transportation Needs: Not on file  Physical Activity: Not on file  Stress: Not on file  Social Connections: Not on file  Intimate Partner Violence: Not on file    Vital Signs: Blood pressure 128/88, pulse 74, temperature 98 F (36.7 C), resp. rate 16, height 5' 10 (1.778 m), weight 224 lb (101.6 kg), SpO2 98%.  Examination: General Appearance: The patient is well-developed, well-nourished, and in no distress. Skin: Gross inspection of skin unremarkable. Head: normocephalic, no gross deformities. Eyes: no gross deformities noted. ENT: ears appear grossly normal no exudates. Neck: Supple. No thyromegaly. No LAD. Respiratory: no rhonchi noted. Cardiovascular: Normal S1 and S2 without murmur or rub. Extremities: No cyanosis. pulses are equal. Neurologic: Alert and oriented. No involuntary movements.  LABS: No results found for this or any previous visit (from the past 2160 hours).  Radiology: DG Chest 2 View Result Date: 08/16/2023 CLINICAL DATA:  Chest pain. EXAM: CHEST - 2 VIEW COMPARISON:  None Available. FINDINGS: Low lung volumes. The heart size and mediastinal contours are within normal limits. Mild left basilar atelectasis. No focal consolidation, pleural effusion, or pneumothorax. No acute osseous abnormality. IMPRESSION: Low lung volumes. Mild left basilar atelectasis. Otherwise, no acute cardiopulmonary findings. Electronically Signed   By: Harrietta Sherry M.D.   On: 08/16/2023 11:11    No results found.  No results found.  Assessment and Plan: Patient Active Problem List   Diagnosis Date Noted   OSA (obstructive sleep apnea) 03/14/2021   Other hypersomnia 03/14/2021   Left ventricular hypokinesis 05/06/2020   Coronary artery disease due to lipid rich  plaque 05/06/2020   Hypertension, poor control 10/20/2018   Dyspnea on exertion 08/18/2018   Parkinson's disease (HCC) 04/14/2018   Tremor 03/17/2018   Essential hypertension 02/10/2018   Hyperlipidemia 02/10/2018   Encounter for general adult medical examination with abnormal findings 02/10/2018   Right-sided muscle weakness 02/10/2018   Dysuria 02/10/2018    1. Obstructive chronic bronchitis without exacerbation (HCC) (Primary) Mild disease noted discussed importance of compliance with medical therapy. He understands. Patient will make a good effor t for this  2. OSA (obstructive sleep apnea) On CPAP and is controlled although he does not use the  machine all night. He will work on trying to improve  3. Parkinson's disease with dyskinesia and fluctuating manifestations (HCC) Follow up with PCP and neuro   General Counseling: I have discussed the findings of the evaluation and examination with Carliss.  I have also discussed any further diagnostic evaluation thatmay be needed or ordered today. Dwyne verbalizes understanding of the  findings of todays visit. We also reviewed his medications today and discussed drug interactions and side effects including but not limited excessive drowsiness and altered mental states. We also discussed that there is always a risk not just to him but also people around him. he has been encouraged to call the office with any questions or concerns that should arise related to todays visit.  No orders of the defined types were placed in this encounter.    Time spent: 61  I have personally obtained a history, examined the patient, evaluated laboratory and imaging results, formulated the assessment and plan and placed orders.    Elfreda DELENA Bathe, MD [facility] Pulmonary and Critical Care Sleep medicine

## 2024-06-12 ENCOUNTER — Other Ambulatory Visit: Payer: Self-pay | Admitting: Neurology

## 2024-06-12 DIAGNOSIS — G20A1 Parkinson's disease without dyskinesia, without mention of fluctuations: Secondary | ICD-10-CM

## 2024-06-21 LAB — PULMONARY FUNCTION TEST

## 2024-06-28 NOTE — Progress Notes (Signed)
 Assessment/Plan:   1.  Parkinsons Disease, diagnosed July, 2019 with symptoms 2 years prior             -Continue carbidopa /levodopa  25/100, 2 tablets at 6 AM, 2 tablet at 9:30 AM/2 at 1 PM/ and 2 at 5 PM             -Continue carbidopa /levodopa  50/200 at bedtime             -Discussed extensively need to increase exercise.             -he declined DA therapy, but I told him today that we may need to think about that in the future.             -Wife asked about driving and we discussed formal driving evaluations through [place], [place] and the private group through [place].  We discussed that Parkinson's disease can slow reflex and reaction time as well as decreased cognition.  Would recommend a driving evaluation and patient can let us  know if he would like to be referred to one of the above sites.   2.  REM behavior disorder             -states that he is taking klonopin  0.5 mg, 1/2 po q hs but has not refilled since March so may be somewhat inconsistent use   3.  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  His lightheadedness is doing well right now.   4.  OSAS             -PSG in June, 2022 with AHI 10.  Now on CPAP and following with pulm.  Last seen May 30, 2024.   5.  Sialorrhea             -Patient not ready for Botox.  Discussed again today  Subjective:   James Bush was seen today in follow up for Parkinsons disease.  Son with patient and supplements hx.  My previous records were reviewed prior to todays visit as well as outside records available to me. Patient with wife who supplements the history.  I saw patient last visit, he was complaining about increasing REM behavior disorder.  We told him to restart the clonazepam .  He did not realize he had refills on it.  It looks like he picked up a 90-day supply in  March, but that was the last time it was refilled. he reports its working and he is still using it.   He is continuing to follow-up with pulmonary for sleep apnea.  Those notes are reviewed.  He has seen his practitioner for his medical wellness check since our last visit.  Notes are reviewed.  He also recently saw his pulmonologist for COPD and sleep apnea.  He is not using his CPAP all night.  Lightheadedness is better.  Drooling is pretty well controlled.  Current prescribed movement disorder medications: carbidopa /levodopa  25/100, 2 tablets at 5 AM, 2 tablet at 9 AM/2 at 2 PM/ and 1 at 5 PM  Carbidopa /levodopa  50/200 at bedtime Clonazepam  0.5 mg, half tablet at bedtime   PREVIOUS MEDICATIONS: Carbidopa /levodopa  25/100 CR (I discontinued this when I first started seeing him to change him to the IR)  ALLERGIES:  No Known Allergies  CURRENT MEDICATIONS:  Outpatient Encounter Medications as  of 07/03/2024  Medication Sig   albuterol  (VENTOLIN  HFA) 108 (90 Base) MCG/ACT inhaler Inhale 2 puffs into the lungs every 6 (six) hours as needed for wheezing or shortness of breath.   amLODipine  (NORVASC ) 5 MG tablet Take 1 tablet (5 mg total) by mouth daily.   aspirin 81 MG tablet Take 81 mg by mouth daily.   carbidopa -levodopa  (SINEMET  CR) 50-200 MG tablet Take 1 tablet by mouth at bedtime.   carbidopa -levodopa  (SINEMET  IR) 25-100 MG tablet TAKE 2 tablets at 6 AM, 2 tablet at 9:30 AM/2 at 1 PM/ and 2 at 5 PM   clonazePAM  (KLONOPIN ) 0.5 MG tablet Take 0.5 tablets (0.25 mg total) by mouth at bedtime.   Fluticasone-Umeclidin-Vilant (TRELEGY ELLIPTA ) 100-62.5-25 MCG/ACT AEPB Inhale 1 puff into the lungs daily.   latanoprost  (XALATAN ) 0.005 % ophthalmic solution SMARTSIG:1 Drop(s) In Eye(s) Every Evening   losartan -hydrochlorothiazide  (HYZAAR) 50-12.5 MG tablet Take 1 tablet by mouth daily.   Multiple Vitamin (MULTIVITAMIN WITH MINERALS) TABS tablet Take 1 tablet by mouth daily.   rosuvastatin  (CRESTOR ) 10  MG tablet Take 1 tablet (10 mg total) by mouth daily.   tadalafil  (CIALIS ) 20 MG tablet Take 1 tablet (20 mg total) by mouth daily as needed for erectile dysfunction.   No facility-administered encounter medications on file as of 07/03/2024.    Objective:   PHYSICAL EXAMINATION:    VITALS:   There were no vitals filed for this visit.   GEN:  The patient appears stated age and is in NAD. HEENT:  Normocephalic, atraumatic.  The mucous membranes are moist.  Cv:  rrr Lungs:  CTAB  Neurological examination:  Orientation: The patient is alert and oriented x3. Cranial nerves: There is good facial symmetry with facial hypomimia. The speech is fluent and clear. Soft palate rises symmetrically and there is no tongue deviation. Hearing is intact to conversational tone. Sensation: Sensation is intact to light touch throughout Motor: Strength is at least antigravity x4.  Movement examination: Tone: There is  mild increased tone in the RUE Abnormal movements: none even with distraction Coordination:  There is mild decremation with RAM's, with finger and toe taps on the R (stable) Gait and Station: The patient has no difficulty arising out of a deep-seated chair without the use of the hands.  He ambulates well in the hall, with decreased arm swing, L>R.  I have reviewed and interpreted the following labs independently    Chemistry      Component Value Date/Time   NA 144 10/29/2023 0852   K 4.0 10/29/2023 0852   CL 105 10/29/2023 0852   CO2 24 10/29/2023 0852   BUN 15 10/29/2023 0852   CREATININE 0.77 10/29/2023 0852      Component Value Date/Time   CALCIUM  9.3 10/29/2023 0852   ALKPHOS 54 10/29/2023 0852   AST 17 10/29/2023 0852   ALT 11 10/29/2023 0852   BILITOT 0.4 10/29/2023 0852       Lab Results  Component Value Date   WBC 6.4 10/29/2023   HGB 13.0 10/29/2023   HCT 39.7 10/29/2023   MCV 92 10/29/2023   PLT 273 10/29/2023    Lab Results  Component Value Date   TSH  1.041 03/28/2020      Cc:  Liana Fish, NP

## 2024-06-30 ENCOUNTER — Other Ambulatory Visit: Payer: Self-pay | Admitting: Internal Medicine

## 2024-06-30 ENCOUNTER — Other Ambulatory Visit

## 2024-06-30 DIAGNOSIS — R0602 Shortness of breath: Secondary | ICD-10-CM

## 2024-06-30 DIAGNOSIS — C61 Malignant neoplasm of prostate: Secondary | ICD-10-CM

## 2024-07-01 LAB — PSA TOTAL (REFLEX TO FREE): Prostate Specific Ag, Serum: 12.2 ng/mL — ABNORMAL HIGH (ref 0.0–4.0)

## 2024-07-03 ENCOUNTER — Ambulatory Visit (INDEPENDENT_AMBULATORY_CARE_PROVIDER_SITE_OTHER): Admitting: Neurology

## 2024-07-03 ENCOUNTER — Encounter: Payer: Self-pay | Admitting: Neurology

## 2024-07-03 VITALS — BP 124/82 | HR 77 | Ht 70.0 in | Wt 226.2 lb

## 2024-07-03 DIAGNOSIS — G20A1 Parkinson's disease without dyskinesia, without mention of fluctuations: Secondary | ICD-10-CM | POA: Diagnosis not present

## 2024-07-03 DIAGNOSIS — G903 Multi-system degeneration of the autonomic nervous system: Secondary | ICD-10-CM | POA: Diagnosis not present

## 2024-07-03 MED ORDER — CARBIDOPA-LEVODOPA 25-100 MG PO TABS: ORAL_TABLET | ORAL | 1 refills | Status: AC

## 2024-07-03 NOTE — Patient Instructions (Signed)

## 2024-07-05 ENCOUNTER — Ambulatory Visit (INDEPENDENT_AMBULATORY_CARE_PROVIDER_SITE_OTHER): Admitting: Urology

## 2024-07-05 VITALS — BP 130/80 | HR 88 | Ht 70.0 in | Wt 226.0 lb

## 2024-07-05 DIAGNOSIS — N529 Male erectile dysfunction, unspecified: Secondary | ICD-10-CM

## 2024-07-05 DIAGNOSIS — C61 Malignant neoplasm of prostate: Secondary | ICD-10-CM | POA: Diagnosis not present

## 2024-07-05 NOTE — Progress Notes (Signed)
   07/05/2024 1:01 PM   James Bush 15-Jan-1961 [id number]  Reason for visit: Follow up prostate cancer on active surveillance, ED, mild BPH  History: Diagnosed with low risk prostate cancer March 2017 when PSA was 6.3, biopsy with 1 core grade group 1 disease.  Confirmatory biopsy October 2017 with 2 cores grade group 1 disease, max core involvement 33%, prostate volume 53g PSA had been stable ranging from 6-9 from 2019 through 2022, but had slowly increased over the last few years.  Highest PSA was 13.4 in December 2024 at time of an acute ER visit for abdominal pain With his Parkinson's disease he opted to defer repeat biopsy or MRI  ED and mild urinary symptoms well-controlled on daily Cialis   Physical Exam: BP 130/80 (BP Location: Left Arm, Patient Position: Sitting, Cuff Size: Normal)   Pulse 88   Ht 5' 10 (1.778 m)   Wt 226 lb (102.5 kg)   SpO2 97%   BMI 32.43 kg/m    Imaging/labs: PSA October 2025 12.2 from 11.06 December 2023, 13.04 August 2023, 9.01 February 2023, 10.01 July 2022, 11.01 June 2022, 8.06 June 2021  Today: He denies any urinary symptoms Cialis  20 mg daily working well for ED and urination  Plan:   Prostate cancer: Low risk, would like to continue on active surveillance.  With his persistently elevated PSA  >10 I again recommended considering MRI or repeat biopsy.  He is fairly averse to a repeat biopsy, especially with his Parkinson's disease.  I think reasonable to consider a prostate MRI if PSA rises > 15. BPH/ED: On daily Cialis , refilled Cialis  refilled, RTC 6 months lab visit PSA, if stable return to yearly screening but if greater than 15 would recommend prostate MRI   Redell JAYSON Burnet, MD  [facility] [address], [address] [place], [place] [address] [phone]

## 2024-07-06 ENCOUNTER — Ambulatory Visit: Admitting: Urology

## 2024-09-05 ENCOUNTER — Ambulatory Visit: Admitting: Internal Medicine

## 2024-10-04 ENCOUNTER — Ambulatory Visit: Payer: Self-pay | Admitting: Nurse Practitioner

## 2024-10-04 ENCOUNTER — Ambulatory Visit (INDEPENDENT_AMBULATORY_CARE_PROVIDER_SITE_OTHER): Admitting: Nurse Practitioner

## 2024-10-04 ENCOUNTER — Encounter: Payer: Self-pay | Admitting: Nurse Practitioner

## 2024-10-04 ENCOUNTER — Ambulatory Visit
Admission: RE | Admit: 2024-10-04 | Discharge: 2024-10-04 | Disposition: A | Source: Home / Self Care | Attending: Nurse Practitioner | Admitting: Nurse Practitioner

## 2024-10-04 ENCOUNTER — Ambulatory Visit
Admission: RE | Admit: 2024-10-04 | Discharge: 2024-10-04 | Disposition: A | Source: Ambulatory Visit | Attending: Nurse Practitioner

## 2024-10-04 VITALS — BP 128/80 | HR 82 | Temp 97.6°F | Resp 16 | Ht 70.0 in | Wt 224.0 lb

## 2024-10-04 DIAGNOSIS — E782 Mixed hyperlipidemia: Secondary | ICD-10-CM | POA: Diagnosis not present

## 2024-10-04 DIAGNOSIS — I10 Essential (primary) hypertension: Secondary | ICD-10-CM | POA: Diagnosis not present

## 2024-10-04 DIAGNOSIS — G20B2 Parkinson's disease with dyskinesia, with fluctuations: Secondary | ICD-10-CM

## 2024-10-04 DIAGNOSIS — R140 Abdominal distension (gaseous): Secondary | ICD-10-CM

## 2024-10-04 DIAGNOSIS — R195 Other fecal abnormalities: Secondary | ICD-10-CM | POA: Diagnosis not present

## 2024-10-04 DIAGNOSIS — R109 Unspecified abdominal pain: Secondary | ICD-10-CM

## 2024-10-04 MED ORDER — ROSUVASTATIN CALCIUM 10 MG PO TABS: 10.0000 mg | ORAL_TABLET | Freq: Every day | ORAL | 1 refills | Status: AC

## 2024-10-04 MED ORDER — MAGNESIUM CITRATE PO SOLN: 148.0000 mL | Freq: Once | ORAL | 0 refills | Status: AC | PRN

## 2024-10-04 MED ORDER — LOSARTAN POTASSIUM-HCTZ 50-12.5 MG PO TABS: 1.0000 | ORAL_TABLET | Freq: Every day | ORAL | 1 refills | Status: AC

## 2024-10-04 NOTE — Telephone Encounter (Signed)
 Patient notified

## 2024-10-04 NOTE — Progress Notes (Signed)
 [facility] [address] [place], [place] [address]  Internal MEDICINE  Office Visit Note  Patient Name: James Bush  [id number]  [id number]  Date of Service: 10/04/2024  Chief Complaint  Patient presents with   Hyperlipidemia   Hypertension   Follow-up    HPI [name] presents for a follow-up visit for hypertension, high cholesterol, parkinsons, abdominal bloating and loose stools.  Abdominal bloating, loose stools, some lower abdominal pain, and active bowel sounds. This has been going on for 3-4 weeks.  Parkinsons disease -- taking carbidopa -levodopa  and followed by neurology,  Hypertension -- controlled with amlodipine  and losartan -hydrochlorothiazide   High cholesterol -- taking rosuvastatin  daily COPD -- taking trelegy daily.     Current Medication: Outpatient Encounter Medications as of 10/04/2024  Medication Sig   albuterol  (VENTOLIN  HFA) 108 (90 Base) MCG/ACT inhaler TAKE 2 PUFFS BY MOUTH EVERY 6 HOURS AS NEEDED FOR WHEEZE OR SHORTNESS OF BREATH   amLODipine  (NORVASC ) 5 MG tablet Take 1 tablet (5 mg total) by mouth daily.   aspirin 81 MG tablet Take 81 mg by mouth daily.   carbidopa -levodopa  (SINEMET  CR) 50-200 MG tablet Take 1 tablet by mouth at bedtime.   carbidopa -levodopa  (SINEMET  IR) 25-100 MG tablet TAKE 2 tablets at 6 AM, 2 tablet at 9:30 AM/2 at 1 PM/ and 2 at 5 PM   clonazePAM  (KLONOPIN ) 0.5 MG tablet Take 0.5 tablets (0.25 mg total) by mouth at bedtime.   Fluticasone-Umeclidin-Vilant (TRELEGY ELLIPTA ) 100-62.5-25 MCG/ACT AEPB Inhale 1 puff into the lungs daily.   latanoprost (XALATAN) 0.005 % ophthalmic solution SMARTSIG:1 Drop(s) In Eye(s) Every Evening   losartan -hydrochlorothiazide  (HYZAAR) 50-12.5 MG tablet Take 1 tablet by mouth daily.   Multiple Vitamin (MULTIVITAMIN WITH MINERALS) TABS tablet Take 1 tablet by mouth daily.   rosuvastatin  (CRESTOR ) 10 MG tablet Take 1 tablet (10 mg total) by mouth daily.   tadalafil  (CIALIS ) 20 MG tablet Take 1  tablet (20 mg total) by mouth daily as needed for erectile dysfunction.   [DISCONTINUED] losartan -hydrochlorothiazide  (HYZAAR) 50-12.5 MG tablet Take 1 tablet by mouth daily.   [DISCONTINUED] rosuvastatin  (CRESTOR ) 10 MG tablet Take 1 tablet (10 mg total) by mouth daily.   No facility-administered encounter medications on file as of 10/04/2024.    Surgical History: Past Surgical History:  Procedure Laterality Date   CARDIAC ELECTROPHYSIOLOGY STUDY AND ABLATION     COLONOSCOPY N/A 03/20/2021   Procedure: COLONOSCOPY;  Surgeon: Therisa Bi, MD;  Location: [facility] ENDOSCOPY;  Service: Gastroenterology;  Laterality: N/A;   EYE SURGERY     strabismus as child   KNEE ARTHROPLASTY Right     Medical History: Past Medical History:  Diagnosis Date   Asthma    Heart murmur    Hyperlipidemia    Hypertension    Parkinson disease (HCC)     Family History: Family History  Problem Relation Age of Onset   COPD Mother    Multiple sclerosis Son    Healthy Daughter    Healthy Daughter    Hematuria Neg Hx    Kidney cancer Neg Hx    Prostate cancer Neg Hx     Social History   Socioeconomic History   Marital status: Married    Spouse name: Not on file   Number of children: 3   Years of education: Not on file   Highest education level: Some college, no degree  Occupational History   Not on file  Tobacco Use   Smoking status: Never    Passive exposure:  Never   Smokeless tobacco: Never  Vaping Use   Vaping status: Never Used  Substance and Sexual Activity   Alcohol use: Not Currently    Alcohol/week: 1.0 standard drink of alcohol    Types: 1 Cans of beer per week   Drug use: No   Sexual activity: Not on file  Other Topics Concern   Not on file  Social History Narrative   Right Handed    Lives in a one story home   Drinks caffeine prn   Lives with wife and his daughter   retired   Social Drivers of Health   Tobacco Use: Low Risk (10/04/2024)   Patient History    Smoking  Tobacco Use: Never    Smokeless Tobacco Use: Never    Passive Exposure: Never  Financial Resource Strain: Not on file  Food Insecurity: Not on file  Transportation Needs: Not on file  Physical Activity: Not on file  Stress: Not on file  Social Connections: Not on file  Intimate Partner Violence: Not on file  Depression (PHQ2-9): Low Risk (04/06/2024)   Depression (PHQ2-9)    PHQ-2 Score: 0  Alcohol Screen: Low Risk (04/01/2023)   Alcohol Screen    Last Alcohol Screening Score (AUDIT): 1  Housing: Not on file  Utilities: Not on file  Health Literacy: Not on file      Review of Systems  Constitutional:  Negative for chills, fatigue and unexpected weight change.  HENT:  Negative for congestion, rhinorrhea, sneezing and sore throat.   Eyes:  Negative for redness.  Respiratory: Negative.  Negative for cough, chest tightness and shortness of breath.   Cardiovascular: Negative.  Negative for chest pain and palpitations.  Gastrointestinal:  Positive for abdominal distention, abdominal pain and diarrhea. Negative for constipation, nausea and vomiting.       Abdominal bloating   Genitourinary:  Negative for dysuria and frequency.  Musculoskeletal: Negative.  Negative for arthralgias, back pain, joint swelling and neck pain.  Skin:  Negative for rash.  Neurological:  Positive for tremors. Negative for numbness.  Hematological:  Negative for adenopathy. Does not bruise/bleed easily.  Psychiatric/Behavioral:  Negative for behavioral problems (Depression), sleep disturbance and suicidal ideas. The patient is not nervous/anxious.     Vital Signs: BP 128/80   Pulse 82   Temp 97.6 F (36.4 C)   Resp 16   Ht 5' 10 (1.778 m)   Wt 224 lb (101.6 kg)   SpO2 95%   BMI 32.14 kg/m    Physical Exam Vitals reviewed.  Constitutional:      General: He is not in acute distress.    Appearance: Normal appearance. He is obese. He is not ill-appearing.  HENT:     Head: Normocephalic and  atraumatic.  Eyes:     Pupils: Pupils are equal, round, and reactive to light.  Cardiovascular:     Rate and Rhythm: Normal rate and regular rhythm.     Heart sounds: Normal heart sounds. No murmur heard. Pulmonary:     Effort: Pulmonary effort is normal. No respiratory distress.     Breath sounds: Normal breath sounds. No wheezing.  Abdominal:     General: Bowel sounds are normal. There is distension.     Palpations: Abdomen is soft.     Tenderness: There is abdominal tenderness in the left lower quadrant. There is no right CVA tenderness or left CVA tenderness.     Hernia: No hernia is present.   Neurological:  Mental Status: He is alert and oriented to person, place, and time.     Gait: Gait abnormal.  Psychiatric:        Mood and Affect: Mood normal. Affect is flat (sometimes, or masked).        Behavior: Behavior normal.        Assessment/Plan: 1. Essential hypertension (Primary) Stable, continue amlodipine  and losartan -hydrochlorothiazide  as prescribed.  - losartan -hydrochlorothiazide  (HYZAAR) 50-12.5 MG tablet; Take 1 tablet by mouth daily.  Dispense: 90 tablet; Refill: 1  2. Mixed hyperlipidemia Continue rosuvastatin  as prescribed.  - rosuvastatin  (CRESTOR ) 10 MG tablet; Take 1 tablet (10 mg total) by mouth daily.  Dispense: 90 tablet; Refill: 1  3. Abdominal bloating with cramps Stat abdominal xray ordered to rule out constipation and fecal impaction  - DG Abd 1 View; Future  4. Loose stools Stat abdominal xray ordered to rule out constipation and fecal impaction  - DG Abd 1 View; Future  5. Parkinson's disease with dyskinesia and fluctuating manifestations (HCC) Continue medication as prescribed and follow up with neurology as scheduled.    General Counseling: zi newbury understanding of the findings of todays visit and agrees with plan of treatment. I have discussed any further diagnostic evaluation that may be needed or ordered today. We also  reviewed his medications today. he has been encouraged to call the office with any questions or concerns that should arise related to todays visit.    Orders Placed This Encounter  Procedures   DG Abd 1 View    Meds ordered this encounter  Medications   losartan -hydrochlorothiazide  (HYZAAR) 50-12.5 MG tablet    Sig: Take 1 tablet by mouth daily.    Dispense:  90 tablet    Refill:  1    Please fill as 90 day supply   rosuvastatin  (CRESTOR ) 10 MG tablet    Sig: Take 1 tablet (10 mg total) by mouth daily.    Dispense:  90 tablet    Refill:  1    Please fill as 90 day supply    Return for previously scheduled, AWV, Ryonna Cimini PCP in august and otherwise as needed. .   Total time spent:30 Minutes Time spent includes review of chart, medications, test results, and follow up plan with the patient.   [place] Controlled Substance Database was reviewed by me.  This patient was seen by Mardy Maxin, FNP-C in collaboration with Dr. Sigrid Bathe as a part of collaborative care agreement.   Aubry Rankin R. Maxin, MSN, FNP-C Internal medicine

## 2024-10-04 NOTE — Progress Notes (Signed)
 Please let the patient know that there is a moderate amount of stool in the colon which is most likely causing his symptoms. I have prescribed magnesium  citrate liquid to help alleviate the issue. He should drink 1/2 the bottle and stay near the bathroom. If no results, he can drink the other half of the bottle the next day.  If still no results, call the clinic

## 2024-10-04 NOTE — Telephone Encounter (Signed)
-----   Message from Mardy Maxin, NP sent at 10/04/2024  1:26 PM EST ----- Please let the patient know that there is a moderate amount of stool in the colon which is most likely causing his symptoms. I have prescribed magnesium  citrate liquid to help alleviate the issue. He  should drink 1/2 the bottle and stay near the bathroom. If no results, he can drink the other half of the bottle the next day.  If still no results, call the clinic

## 2024-11-27 ENCOUNTER — Ambulatory Visit: Admitting: Internal Medicine

## 2025-01-02 ENCOUNTER — Ambulatory Visit: Admitting: Neurology

## 2025-01-05 ENCOUNTER — Other Ambulatory Visit

## 2025-04-09 ENCOUNTER — Ambulatory Visit: Admitting: Nurse Practitioner
